# Patient Record
Sex: Male | Born: 1970 | Race: White | Hispanic: No | Marital: Married | Smoking: Former smoker
Health system: Southern US, Community
[De-identification: ages and names within clinical notes are randomized; demographics above are authoritative.]

## PROBLEM LIST (undated history)

## (undated) DIAGNOSIS — R222 Localized swelling, mass and lump, trunk: Secondary | ICD-10-CM

## (undated) DIAGNOSIS — K4090 Unilateral inguinal hernia, without obstruction or gangrene, not specified as recurrent: Secondary | ICD-10-CM

## (undated) HISTORY — PX: HERNIA REPAIR: SHX51

## (undated) HISTORY — PX: VASECTOMY: SHX75

## (undated) HISTORY — DX: Unilateral inguinal hernia, without obstruction or gangrene, not specified as recurrent: K40.90

## (undated) HISTORY — DX: Localized swelling, mass and lump, trunk: R22.2

---

## 1989-04-27 HISTORY — PX: THORACOTOMY: SUR1349

## 2017-10-13 ENCOUNTER — Ambulatory Visit (INDEPENDENT_AMBULATORY_CARE_PROVIDER_SITE_OTHER): Payer: BC Managed Care – PPO | Admitting: Osteopathic Medicine

## 2017-10-13 ENCOUNTER — Encounter (INDEPENDENT_AMBULATORY_CARE_PROVIDER_SITE_OTHER): Payer: Self-pay

## 2017-10-13 ENCOUNTER — Encounter: Payer: Self-pay | Admitting: Osteopathic Medicine

## 2017-10-13 VITALS — BP 121/83 | HR 59 | Temp 98.4°F | Ht 74.0 in | Wt 212.7 lb

## 2017-10-13 DIAGNOSIS — Z23 Encounter for immunization: Secondary | ICD-10-CM

## 2017-10-13 DIAGNOSIS — D229 Melanocytic nevi, unspecified: Secondary | ICD-10-CM

## 2017-10-13 DIAGNOSIS — H01149 Xeroderma of unspecified eye, unspecified eyelid: Secondary | ICD-10-CM

## 2017-10-13 DIAGNOSIS — F1010 Alcohol abuse, uncomplicated: Secondary | ICD-10-CM | POA: Diagnosis not present

## 2017-10-13 DIAGNOSIS — Z833 Family history of diabetes mellitus: Secondary | ICD-10-CM

## 2017-10-13 DIAGNOSIS — Z0000 Encounter for general adult medical examination without abnormal findings: Secondary | ICD-10-CM

## 2017-10-13 DIAGNOSIS — L821 Other seborrheic keratosis: Secondary | ICD-10-CM | POA: Diagnosis not present

## 2017-10-13 NOTE — Patient Instructions (Signed)
Mole appears benign but given that it's so dark, we would be smart to rule out melanoma. Please return to the office in the next month or so for removal.   Work on cutting back alcohol intake. Some people with alcohol use disorder are able to reduce their alcohol use to low-risk levels. Others need to completely quit drinking alcohol. When necessary, mental health professionals with specialized training in substance use treatment can help.If you drink, limit alcohol intake to no more than 1 drink a day for nonpregnant women and 2 drinks a day for men. One drink equals 12 oz of beer, 5 oz of wine, or 1 oz of hard liquor.  Will get blood work today for routine screening. Plan for colon cancer screening at 1. Recommend flu shot every fall (around Halloween).

## 2017-10-13 NOTE — Progress Notes (Signed)
HPI: Anthony Leach is a 47 y.o. male who  has no past medical history on file.  he presents to [facility] today, 10/13/17,  for chief complaint of: New to establish care   Pleasant new patient here to establish care. Works as a history Pharmacist, hospital, local high school. Is married, has two teen kids.   Hx rash/eczema - topical steroid to eyelids Desonide 0.05% 2-3 times per week. Skin tags there as well, seems to run in his family. No vision problems.   Reports occasional erectile issues. No depression or fatigue.   Concern for a mole on back, dark, no tsure how long it's been there, would like this looked at.   Former smoker. 1/4 ppd x2 years in the 190's. No illicit drug use. Reports alcohol overuse, 20 drinks per week, daily drinking. Few beers per day, he has been working on cutting back.   Hx vasectomy and thoracotomy to remove benign chest mass, diagnosis was uncertain but he had some followup Xrays for many years after and everything was normal. 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 Marland Kitchen   Has a living will.      Past medical, surgical, social and family history reviewed:  Patient Active Problem List   Diagnosis Date Noted  . Atypical mole 10/13/2017  . 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 Excessive drinking of alcohol 10/13/2017  . Family history of diabetes mellitus 10/13/2017    Past Surgical History:  Procedure Laterality Date  . THORACOTOMY  1991   Removal of benign mass in Chest area  . VASECTOMY      Social History   Tobacco Use  . Smoking status: Former Smoker    Types: Cigarettes    Last attempt to quit: 1990    Years since quitting: 29.4  . Smokeless tobacco: Never Used  Substance Use Topics  . Alcohol use: Yes    Comment: 20 DRINKS/WK    Family History  Problem Relation Age of Onset  . Diabetes Mother   . High blood  pressure Father      Current medication list and allergy/intolerance information reviewed:    Current Outpatient Medications  Medication Sig Dispense Refill  . desonide (DESOWEN) 0.05 % cream Apply topically 2 (two) times daily.     No current facility-administered medications for this visit.    No Known Allergies    Review of Systems:  Constitutional:  No  fever, no chills, No recent illness, No unintentional weight changes. No significant fatigue.   HEENT: No  headache, no vision change, no hearing change, No sore throat, No  sinus pressure  Cardiac: No  chest pain, No  pressure, No palpitations, No  Orthopnea  Respiratory:  No  shortness of breath. No  Cough  Gastrointestinal: No  abdominal pain, No  nausea, No  vomiting,  No  blood in stool, No  diarrhea, No  constipation   Musculoskeletal: No new myalgia/arthralgia  Skin: +Rash, +other wounds/concerning lesions  Genitourinary: No  incontinence, No  abnormal genital bleeding, No abnormal genital discharge  Endocrine: No cold intolerance,  No heat intolerance.    Neurologic: No  weakness, No  dizziness,   Psychiatric: No  concerns with depression, No  concerns with anxiety, No sleep problems, No mood problems  Exam:  BP 121/83   Pulse (!) 59   Temp 98.4 F (36.9 C) (Oral)   Ht  6\' 2"  (1.88 m)   Wt 212 lb 11.2 oz (96.5 kg)   BMI 27.31 kg/m   Constitutional: VS see above. General Appearance: alert, well-developed, well-nourished, NAD  Eyes: Normal lids and conjunctive, non-icteric sclera  Ears, Nose, Mouth, Throat: MMM, Normal external inspection ears/nares/mouth/lips/gums. TM normal bilaterally. Pharynx/tonsils no erythema, no exudate. Nasal mucosa normal.   Neck: No masses, trachea midline. No thyroid enlargement. No tenderness/mass appreciated. No lymphadenopathy  Respiratory: Normal respiratory effort. no wheeze, no rhonchi, no rales  Cardiovascular: S1/S2 normal, no murmur, no rub/gallop auscultated.  RRR. No lower extremity edema.   Gastrointestinal: Nontender, no masses. No hepatomegaly, no splenomegaly. No hernia appreciated. Bowel sounds normal. Rectal exam deferred.   Musculoskeletal: Gait normal. No clubbing/cyanosis of digits.   Neurological: Normal balance/coordination. No tremor. No cranial nerve deficit on limited exam. Motor and sensation intact and symmetric. Cerebellar reflexes intact.   Skin: warm, dry, intact. No rash/ulcer. No concerning nevi or subq nodules on limited exam.  Dry eyelids, no flaking skin. Small hyperpigmented mole on L back, see photo. Few benign seborrheic keratoses.    Psychiatric: Normal judgment/insight. Normal mood and affect. Oriented x3.          ASSESSMENT/PLAN:   Annual physical exam - Plan: CBC, COMPLETE METABOLIC PANEL WITH GFR, Lipid panel, Hemoglobin A1c  Xeroderma of eyelid, unspecified laterality - Plan: Ambulatory referral to Ophthalmology  Atypical mole - hyperpigmented but appears benign, will follow-up for biopsy removal   Seborrheic keratoses  Excessive drinking of alcohol  Family history of diabetes mellitus - Plan: Hemoglobin A1c  Need for Tdap vaccination - Plan: Tdap vaccine greater than or equal to 7yo IM    MALE PREVENTIVE CARE  updated 10/13/17  ANNUAL SCREENING/COUNSELING  Any changes to health in the past year? no  Diet/Exercise - HEALTHY HABITS DISCUSSED TO DECREASE CV RISK Social History   Tobacco Use  Smoking Status Former Smoker  . Types: Cigarettes  . Last attempt to quit: 1990  . Years since quitting: 29.4  Smokeless Tobacco Never Used   Social History   Substance and Sexual Activity  Alcohol Use Yes   Comment: 20 DRINKS/WK   Depression screen PHQ 2/9 10/13/2017  Decreased Interest 0  Down, Depressed, Hopeless 0  PHQ - 2 Score 0  Altered sleeping 0  Tired, decreased energy 0  Change in appetite 0  Feeling bad or failure about yourself  0  Trouble concentrating 0  Moving slowly or  fidgety/restless 0  Suicidal thoughts 0  PHQ-9 Score 0  Difficult doing work/chores Not difficult at all    Finger  Sexually active in the past year? - Yes with male.  STI testing needed/desired today? - no  Any concerns with testosterone/libido? - no  INFECTIOUS DISEASE SCREENING  HIV - does not need  GC/CT - does not need  HepC - does not need  TB - does not need  CANCER SCREENING  Lung - USPSTF: 55-80yo w/ 30 py hx unless quit w/in 48yr - does not need  Colon - does not need - no FH  Prostate - does not need - no FH  OTHER DISEASE SCREENING  Lipid - needs  DM2 - needs  AAA - 65-75yo ever smoked: does not need  Osteoporosis - men 48yo+ - does not need  ADULT VACCINATION  Influenza - annual vaccine recommended  Td - booster every 10 years   Zoster - Shingrix recommended 35+ years old  PCV13 - was not indicated  PPSV23 -  was not indicated Immunization History  Administered Date(s) Administered  . Tdap 10/13/2017    OTHER  Fall - exercise and Vit D age 64+ - does not need  Consider ASA - age 47-59 - does not need      Patient Instructions  Mole appears benign but given that it's so dark, we would be smart to rule out melanoma. Please return to the office in the next month or so for removal.   Work on cutting back alcohol intake. Some people with alcohol use disorder are able to reduce their alcohol use to low-risk levels. Others need to completely quit drinking alcohol. When necessary, mental health professionals with specialized training in substance use treatment can help.If you drink, limit alcohol intake to no more than 1 drink a day for nonpregnant women and 2 drinks a day for men. One drink equals 12 oz of beer, 5 oz of wine, or 1 oz of hard liquor.  Will get blood work today for routine screening. Plan for colon cancer screening at 66. Recommend flu shot every fall (around Halloween).      Visit summary with  medication list and pertinent instructions was printed for patient to review. All questions at time of visit were answered - patient instructed to contact office with any additional concerns. ER/RTC precautions were reviewed with the patient.   Follow-up plan: Return for biospy/removal abnormal mole .   Please note: voice recognition software was used to produce this document, and typos may escape review. Please contact Dr. Sheppard Coil for any needed clarifications.

## 2017-11-04 ENCOUNTER — Ambulatory Visit: Payer: BC Managed Care – PPO | Admitting: Osteopathic Medicine

## 2017-11-04 ENCOUNTER — Encounter: Payer: Self-pay | Admitting: Osteopathic Medicine

## 2017-11-04 VITALS — BP 113/77 | HR 88 | Temp 98.0°F | Ht 74.0 in | Wt 212.0 lb

## 2017-11-04 DIAGNOSIS — L819 Disorder of pigmentation, unspecified: Secondary | ICD-10-CM

## 2017-11-04 NOTE — Progress Notes (Addendum)
HPI: Anthony Leach is a 47 y.o. male who  has no past medical history on file.  he presents to [facility] today, 11/04/17,  for chief complaint of:  Skin procedure - mole removal/biopsy  Informed consent obtained verbally after discussion of risks/benefits:   Risks include but not limited to: incomplete excision, need for further procedures, bleeding, infection, pain, damage to surrounding tissues, anesthesia reaction  Benefits: removal of suspicious and bothersome lesion, definitive histological diagnosis.     ASSESSMENT/PLAN: The encounter diagnosis was Hyperpigmented skin lesion.   PRE-OP DIAGNOSIS: Abnormal Skin Lesion of left side of torso POST-OP DIAGNOSIS: Same  PROCEDURE: skin biopsy Performing Physician: Emeterio Reeve   PROCEDURE:  Excision biopsy    The area surrounding the skin lesion was prepared and draped in the usual sterile manner. The lesion was removed with a ellipse of skin in the usual manner by the biopsy method noted above with at least 1 mm margins. Hemostasis was assured. The patient tolerated the procedure well.   Closure:      Suture 4.0 prolene simple interrupted x4 w/ one horizontal mattress in the center (5 sutures totalL   Followup: The patient tolerated the procedure well without complications.  Standard post-procedure care is explained and return precautions are given.      Follow-up plan: Return in about 10 days (around 11/14/2017) for suture removal 10-14 days (11/15/17-11/18/17) w/ Evlyn Clines or whoever is free .     ############################################ ############################################ ############################################ ############################################    Outpatient Encounter Medications as of 11/04/2017  Medication Sig  . desonide (DESOWEN) 0.05 % cream Apply topically 2 (two) times daily.   No facility-administered encounter medications on file as of  11/04/2017.    No Known Allergies    Review of Systems:  Constitutional: No recent illness  Skin: No new Rash   Exam:  BP 113/77   Pulse 88   Temp 98 F (36.7 C) (Oral)   Ht 6\' 2"  (1.88 m)   Wt 212 lb (96.2 kg)   SpO2 98%   BMI 27.22 kg/m   Constitutional: VS see above. General Appearance: alert, well-developed, well-nourished, NAD  Respiratory: Normal respiratory effort.  Musculoskeletal: Gait normal.   Neurological: Normal balance/coordination. No tremor.  Skin: warm, dry, intact. Mole as pictured below. No significant asymmetry to hyperpigmented region, borders fairly clear, consistent color no blue/white, diameter 0.5 cm, pt reports no significant change in appearance for some time   Psychiatric: Normal judgment/insight. Normal mood and affect. Oriented x3.   Visit summary with medication list and pertinent instructions was printed for patient to review, advised to alert [place] if any changes needed. All questions at time of visit were answered - patient instructed to contact office with any additional concerns. ER/RTC precautions were reviewed with the patient and understanding verbalized.   Follow-up plan: Return in about 10 days (around 11/14/2017) for suture removal 10-14 days (11/15/17-11/18/17) w/ Evlyn Clines or whoever is free .    Please note: voice recognition software was used to produce this document, and typos may escape review. Please contact Dr. Sheppard Coil for any needed clarifications.

## 2017-11-04 NOTE — Patient Instructions (Signed)
Skin Biopsy, Care After Refer to this sheet in the next few weeks. These instructions provide you with information about caring for yourself after your procedure. Your health care provider may also give you more specific instructions. Your treatment has been planned according to current medical practices, but problems sometimes occur. Call your health care provider if you have any problems or questions after your procedure. What can I expect after the procedure? After the procedure, it is common to have:  Soreness.  Bruising.  Itching.  Follow these instructions at home:  Rest and then return to your normal activities as told by your health care provider.  Take over-the-counter and prescription medicines only as told by your health care provider.  Follow instructions from your health care provider about how to take care of your biopsy site.Make sure you: ? Wash your hands with soap and water before you change your bandage (dressing). If soap and water are not available, use hand sanitizer. ? Change your dressing as told by your health care provider. ? Leave stitches (sutures), skin glue, or adhesive strips in place. These skin closures may need to stay in place for 2 weeks or longer. If adhesive strip edges start to loosen and curl up, you may trim the loose edges. Do not remove adhesive strips completely unless your health care provider tells you to do that. If the biopsy area bleeds, apply gentle pressure for 10 minutes.  Check your biopsy site every day for signs of infection. Check for: ? More redness, swelling, or pain. ? More fluid or blood. ? Warmth. ? Pus or a bad smell.  Keep all follow-up visits as told by your health care provider. This is important. Contact a health care provider if:  You have more redness, swelling, or pain around your biopsy site.  You have more fluid or blood coming from your biopsy site.  Your biopsy site feels warm to the touch.  You have pus or  a bad smell coming from your biopsy site.  You have a fever. Get help right away if:  You have bleeding that does not stop with pressure or a dressing. This information is not intended to replace advice given to you by your health care provider. Make sure you discuss any questions you have with your health care provider. Document Released: 05/10/2015 Document Revised: 12/08/2015 Document Reviewed: 07/11/2014 Elsevier Interactive Patient Education  2018 Elsevier Inc.  

## 2017-11-15 ENCOUNTER — Ambulatory Visit (INDEPENDENT_AMBULATORY_CARE_PROVIDER_SITE_OTHER): Payer: BC Managed Care – PPO | Admitting: Physician Assistant

## 2017-11-15 ENCOUNTER — Encounter: Payer: Self-pay | Admitting: Physician Assistant

## 2017-11-15 VITALS — BP 118/80 | HR 55 | Wt 211.0 lb

## 2017-11-15 DIAGNOSIS — Z4802 Encounter for removal of sutures: Secondary | ICD-10-CM | POA: Diagnosis not present

## 2017-11-15 NOTE — Patient Instructions (Signed)
Suture Removal, Care After Refer to this sheet in the next few weeks. These instructions provide you with information on caring for yourself after your procedure. Your health care provider may also give you more specific instructions. Your treatment has been planned according to current medical practices, but problems sometimes occur. Call your health care provider if you have any problems or questions after your procedure. What can I expect after the procedure? After your stitches (sutures) are removed, it is typical to have the following:  Some discomfort and swelling in the wound area.  Slight redness in the area.  Follow these instructions at home:  If you have skin adhesive strips over the wound area, do not take the strips off. They will fall off on their own in a few days. If the strips remain in place after 14 days, you may remove them.  Change any bandages (dressings) at least once a day or as directed by your health care provider. If the bandage sticks, soak it off with warm, soapy water.  Apply cream or ointment only as directed by your health care provider. If using cream or ointment, wash the area with soap and water 2 times a day to remove all the cream or ointment. Rinse off the soap and pat the area dry with a clean towel.  Keep the wound area dry and clean. If the bandage becomes wet or dirty, or if it develops a bad smell, change it as soon as possible.  Continue to protect the wound from injury.  Use sunscreen when out in the sun. New scars become sunburned easily. Contact a health care provider if:  You have increasing redness, swelling, or pain in the wound.  You see pus coming from the wound.  You have a fever.  You notice a bad smell coming from the wound or dressing.  Your wound breaks open (edges not staying together). This information is not intended to replace advice given to you by your health care provider. Make sure you discuss any questions you have  with your health care provider. Document Released: 01/06/2001 Document Revised: 09/19/2015 Document Reviewed: 11/23/2012 Elsevier Interactive Patient Education  2017 Elsevier Inc.  

## 2017-11-15 NOTE — Progress Notes (Signed)
Subjective:    Anthony Leach is a 47 y.o. male who underwent excisional biopsy 10 days ago, which required closure with 5 sutures.  He denies pain, redness, or drainage from the wound.   The following portions of the patient's history were reviewed and updated as appropriate: allergies, current medications, past family history, past medical history, past social history, past surgical history and problem list.  Review of Systems Pertinent items are noted in HPI.    Objective:    BP 118/80   Pulse (!) 55   Wt 211 lb (95.7 kg)   BMI 27.09 kg/m  Injury exam:  A 1 cm incision noted on the right flank is healing well, without evidence of infection.    Assessment:    Laceration is healing well, without evidence of infection.    Plan:     1. 4 simple interrupted and 1 horizontal mattress sutures were removed. 2. Wound care discussed. 3. Follow up as needed.     Darlyne Russian PA-C

## 2018-01-25 LAB — COMPLETE METABOLIC PANEL WITH GFR
AG RATIO: 1.9 (calc) (ref 1.0–2.5)
ALBUMIN MSPROF: 4.5 g/dL (ref 3.6–5.1)
ALT: 24 U/L (ref 9–46)
AST: 19 U/L (ref 10–40)
Alkaline phosphatase (APISO): 55 U/L (ref 40–115)
BUN: 12 mg/dL (ref 7–25)
CALCIUM: 9.6 mg/dL (ref 8.6–10.3)
CO2: 27 mmol/L (ref 20–32)
Chloride: 101 mmol/L (ref 98–110)
Creat: 0.95 mg/dL (ref 0.60–1.35)
GFR, EST NON AFRICAN AMERICAN: 95 mL/min/{1.73_m2} (ref >=60)
GFR, Est African American: 110 mL/min/{1.73_m2} (ref >=60)
GLOBULIN: 2.4 g/dL (ref 1.9–3.7)
Glucose, Bld: 98 mg/dL (ref 65–139)
POTASSIUM: 4.1 mmol/L (ref 3.5–5.3)
SODIUM: 137 mmol/L (ref 135–146)
Total Bilirubin: 1 mg/dL (ref 0.2–1.2)
Total Protein: 6.9 g/dL (ref 6.1–8.1)

## 2018-01-25 LAB — CBC
HEMATOCRIT: 40.7 % (ref 38.5–50.0)
Hemoglobin: 14.1 g/dL (ref 13.2–17.1)
MCH: 31 pg (ref 27.0–33.0)
MCHC: 34.6 g/dL (ref 32.0–36.0)
MCV: 89.5 fL (ref 80.0–100.0)
MPV: 11 fL (ref 7.5–12.5)
Platelets: 204 10*3/uL (ref 140–400)
RBC: 4.55 10*6/uL (ref 4.20–5.80)
RDW: 12.1 % (ref 11.0–15.0)
WBC: 5.3 10*3/uL (ref 3.8–10.8)

## 2018-01-25 LAB — LIPID PANEL
Cholesterol: 219 mg/dL — ABNORMAL HIGH (ref <200)
HDL: 74 mg/dL (ref >40)
LDL Cholesterol (Calc): 127 mg/dL (calc) — ABNORMAL HIGH
NON-HDL CHOLESTEROL (CALC): 145 mg/dL — AB (ref <130)
Total CHOL/HDL Ratio: 3 (calc) (ref <5.0)
Triglycerides: 82 mg/dL (ref <150)

## 2018-01-25 LAB — HEMOGLOBIN A1C
EAG (MMOL/L): 5.7 (calc)
Hgb A1c MFr Bld: 5.2 % of total Hgb (ref <5.7)
Mean Plasma Glucose: 103 (calc)

## 2018-03-23 ENCOUNTER — Ambulatory Visit: Payer: BC Managed Care – PPO | Admitting: Sports Medicine

## 2018-04-04 ENCOUNTER — Encounter

## 2018-04-04 ENCOUNTER — Ambulatory Visit: Payer: BC Managed Care – PPO | Admitting: Sports Medicine

## 2018-04-04 ENCOUNTER — Encounter: Payer: Self-pay | Admitting: Sports Medicine

## 2018-04-04 ENCOUNTER — Ambulatory Visit: Payer: Self-pay

## 2018-04-04 VITALS — BP 124/82 | HR 79 | Ht 74.0 in | Wt 216.2 lb

## 2018-04-04 DIAGNOSIS — M25521 Pain in right elbow: Secondary | ICD-10-CM

## 2018-04-04 DIAGNOSIS — M7711 Lateral epicondylitis, right elbow: Secondary | ICD-10-CM | POA: Diagnosis not present

## 2018-04-04 MED ORDER — NITROGLYCERIN 0.2 MG/HR TD PT24: MEDICATED_PATCH | TRANSDERMAL | 1 refills | Status: DC

## 2018-04-04 NOTE — Procedures (Signed)
LIMITED MSK ULTRASOUND OF Right elbow Images were obtained and interpreted by myself, Teresa Coombs, DO  Images have been saved and stored to PACS system. Images obtained on: GE S7 Ultrasound machine  FINDINGS:   Common extensor tendons with marked hypoechoic change with in the midportion of the tendon at the lateral epicondyle.  Slight fragmentation of the superior aspect of the lateral epicondyle.  IMPRESSION:  1. Lateral epicondylosis

## 2018-04-04 NOTE — Progress Notes (Signed)
PROCEDURE NOTE: THERAPEUTIC EXERCISES (97110) 15 minutes spent for Therapeutic exercises as below and as referenced in the AVS.  This included exercises focusing on stretching, strengthening, with significant focus on eccentric aspects.   Proper technique shown and discussed handout in great detail with ATC.  All questions were discussed and answered.   Long term goals include an improvement in range of motion, strength, endurance as well as avoiding reinjury. Frequency of visits is one time as determined during today's  office visit. Frequency of exercises to be performed is as per handout.  EXERCISES REVIEWED: hammer exercises, and eccentric exercises

## 2018-04-04 NOTE — Progress Notes (Signed)
Anthony Leach, [facility] - 47 y.o. male MRN [id number]  Date of birth: [birth year]  Visit Date: 04/04/2018  PCP: Emeterio Reeve, DO   Referred by: Emeterio Reeve, DO   SUBJECTIVE:  Chief Complaint  Patient presents with  . New Patient (Initial Visit)    R arm pain   HPI: Patient with several months of worsening right pain that is progressively worsened the past 1 month.  He reports doing a moderate amount of manual labor in the yard this summer symptoms began time.  He has been using a tennis elbow strap occasionally outdoors with minimal improvement.  The pain is in the posterior arm and radiates into the posterior aspect of the forearm.  Activities that worsen this include picking up milk pronation supination and using a mouse.  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  Patient is using a counterforce strap with some mild improvement with this.  REVIEW OF SYSTEMS: Reports, Mild night time disturbances. Denies fevers, chills, or night sweats. Denies unexplained weight loss. Denies personal history of cancer. Denies changes in bowel or bladder habits. Denies recent unreported falls. Denies new or worsening dyspnea or wheezing. Denies headaches or dizziness.  Reports, Mild weakness in his right upper extremity otherwise no numbness, tingling or weakness  In the extremities.  Denies dizziness or presyncopal episodes Denies lower extremity edema   HISTORY:  Prior history reviewed and updated per electronic medical record.  Social History   Occupational History    Employer: [employer]  Tobacco Use  . Smoking status: Former Smoker    Types: Cigarettes    Last attempt to quit: 1990    Years since quitting: 29.9  . Smokeless tobacco: Never Used  Substance and Sexual Activity  . Alcohol use: Yes    Comment: 20 DRINKS/WK  . Drug use: Never  .  Sexual activity: Yes    Partners: Female    Birth control/protection: None   Social History   Social History Narrative  . Not on file    History reviewed. No pertinent past medical history.  Past Surgical History:  Procedure Laterality Date  . THORACOTOMY  1991   Removal of benign mass in Chest area  . VASECTOMY      family history includes Diabetes in his mother; High blood pressure in his father.  DATA OBTAINED & REVIEWED:   Recent Labs    10/13/17 1107  HGBA1C 5.2  CALCIUM 9.6  AST 19  ALT 24   No problems updated. No specialty comments available.  OBJECTIVE:  VS:  HT:6\' 2"  (188 cm)   WT:216 lb 3.2 oz (98.1 kg)  BMI:27.75    BP:124/82  HR:79bpm  TEMP: ( )  RESP:99 %   PHYSICAL EXAM: CONSTITUTIONAL: Well-developed, Well-nourished and In no acute distress PSYCHIATRIC : Alert & appropriately interactive. and Not depressed or anxious appearing. RESPIRATORY : No increased work of breathing and Trachea Midline EYES : Pupils are equal., EOM intact without nystagmus. and No scleral icterus.  VASCULAR EXAM : Warm and well perfused NEURO: unremarkable  MSK Exam:  Right elbow  Well aligned No significant deformity. No overlying skin changes TTP over: Posterior aspect of the forearm, extensor musculature and most focally over the lateral epicondyle.  No swelling No effusion Ligamentous stable No significant strength discrepancies however he does have pain with wrist extension.  Pain is  localized to the lateral epicondylitis.  Pain is also exacerbated with supination pronation activities.  No significant pain with third finger resistance.     ASSESSMENT  1. Right elbow pain   2. Lateral epicondylitis of right elbow     PLAN:  Pertinent additional documentation may be included in corresponding procedure notes, imaging studies, problem based documentation and patient instructions.  Procedures:  Discussed the foundation of treatment for this condition is  physical therapy and/or daily (5-6 days/week) therapeutic exercises, focusing on core strengthening, coordination, neuromuscular control/reeducation.  Therapeutic exercises prescribed per procedure note.  Medications:  Meds ordered this encounter  Medications  . nitroGLYCERIN (NITRODUR - DOSED IN MG/24 HR) 0.2 mg/hr patch    Sig: Place 1/4 to 1/2 of a patch over affected region. Remove and replace once daily.  Slightly alter skin placement daily    Dispense:  30 patch    Refill:  1    For musculoskeletal purposes.  Okay to cut patch.    Discussion/Instructions: No problem-specific Assessment & Plan notes found for this encounter.  Discussed red flag symptoms that warrant earlier emergent evaluation and patient voices understanding. Activity modifications and the importance of avoiding exacerbating activities (limiting pain to no more than a 4 / 10 during or following activity) recommended and discussed. Ultimately patient has tendinopathic changes on MSK ultrasound.  Nitroglycerin protocol recommended and discussed in detail.   If any lack of improvement: consider Biologic Therapy with PRP  Return in about 6 weeks (around 05/16/2018).          Gerda Diss, [facility] Physician

## 2018-04-04 NOTE — Patient Instructions (Signed)
Please perform the exercise program that we have prepared for you and gone over in detail on a daily basis.  In addition to the handout you were provided you can access your program through: [email or web address]   Your unique program code is:  [email or web address]    Nitroglycerin Protocol   Apply 1/4 nitroglycerin patch to affected area daily.  Change position of patch within the affected area every 24 hours.  You may experience a headache during the first 1-2 weeks of using the patch, these should subside.  If you experience headaches after beginning nitroglycerin patch treatment, you may take your preferred over the counter pain reliever.  Another side effect of the nitroglycerin patch is skin irritation or rash related to patch adhesive.  Please notify our office if you develop more severe headaches or rash, and stop the patch.  Tendon healing with nitroglycerin patch may require 12 to 24 weeks depending on the extent of injury.  Men should not use if taking Viagra, Cialis, or Levitra.   Do not use if you have migraines or rosacea.

## 2018-05-16 ENCOUNTER — Ambulatory Visit: Payer: BC Managed Care – PPO | Admitting: Sports Medicine

## 2018-05-16 ENCOUNTER — Encounter: Payer: Self-pay | Admitting: Sports Medicine

## 2018-05-16 VITALS — BP 140/84 | HR 66 | Ht 74.0 in | Wt 220.4 lb

## 2018-05-16 DIAGNOSIS — M7711 Lateral epicondylitis, right elbow: Secondary | ICD-10-CM

## 2018-05-16 DIAGNOSIS — M25521 Pain in right elbow: Secondary | ICD-10-CM | POA: Diagnosis not present

## 2018-05-16 NOTE — Progress Notes (Signed)
  Anthony Leach, [place] at [place] - 48 y.o. male MRN [id number]  Date of birth: [birth year]  Visit Date: May 16, 2018  PCP: Emeterio Reeve, DO   Referred by: Emeterio Reeve, DO  SUBJECTIVE:  Chief Complaint  Patient presents with  . Right Elbow - Follow-up    Sx are improving slightly .  Following Nitro Protocol, minimal HA initially, this has now resolved. Using Counterforce Strap prn. Provided with HEP - hammer exercises, and eccentric exercises. He has been doing these almost daily.      HPI: History reviewed as above.  He is doing quite well.  REVIEW OF SYSTEMS: Per HPI  HISTORY:  Prior history reviewed and updated per electronic medical record.  Social History   Occupational History    Employer: [employer]  Tobacco Use  . Smoking status: Former Smoker    Types: Cigarettes    Last attempt to quit: 1990    Years since quitting: 30.0  . Smokeless tobacco: Never Used  Substance and Sexual Activity  . Alcohol use: Yes    Comment: 20 DRINKS/WK  . Drug use: Never  . Sexual activity: Yes    Partners: Female    Birth control/protection: None   Social History   Social History Narrative  . Not on file    OBJECTIVE:  VS:  HT:6\' 2"  (188 cm)   WT:220 lb 6.4 oz (100 kg)  BMI:28.29    BP:140/84  HR:66bpm  TEMP: ( )  RESP:97 %   PHYSICAL EXAM: Adult male.  No acute distress.  Alert and appropriate. Right elbow has a small amount of pain over the lateral condyle focally but this is minimal.  Wrist extension strength is 5/5.  He has good elbow flexion and extension although some tightness with functional terminal range of motion.   ASSESSMENT  1. Right elbow pain   2. Lateral epicondylitis of right elbow      PROCEDURES:  None  PLAN:  Pertinent additional documentation may be included in corresponding procedure notes, imaging studies, problem based  documentation and patient instructions.  He is doing quite well.  We will plan to have him follow-up in 6 weeks and likely repeat ultrasound at that time.  He has made good progress and has only intermittent symptoms especially with overuse.  Titrate patch to half a patch in continue with gentle massage, home therapeutic exercises and icing plus counterforce brace as needed.  Activity modifications and the importance of avoiding exacerbating activities (limiting pain to no more than a 4 / 10 during or following activity) recommended and discussed. Discussed red flag symptoms that warrant earlier emergent evaluation and patient voices understanding.  At follow up will plan to consider repeat [facility] Ultrasound  Return in about 6 weeks (around 06/27/2018).          Gerda Diss, [facility] Physician

## 2018-06-29 ENCOUNTER — Encounter: Payer: Self-pay | Admitting: Sports Medicine

## 2018-06-29 ENCOUNTER — Ambulatory Visit: Payer: BC Managed Care – PPO | Admitting: Sports Medicine

## 2018-06-29 VITALS — BP 108/78 | HR 59 | Ht 74.0 in | Wt 217.0 lb

## 2018-06-29 DIAGNOSIS — M7711 Lateral epicondylitis, right elbow: Secondary | ICD-10-CM

## 2018-06-29 DIAGNOSIS — M25521 Pain in right elbow: Secondary | ICD-10-CM | POA: Diagnosis not present

## 2018-07-02 NOTE — Progress Notes (Signed)
Anthony Leach. Anthony Leach, [place] at [place] - 48 y.o. male MRN [id number]  Date of birth: 1970/12/01  Visit Date: 06/29/2018  PCP: Emeterio Reeve, DO   Referred by: Emeterio Reeve, DO  SUBJECTIVE:   Chief Complaint  Patient presents with  . Follow-up    R elbow.  Nitro.  Counterforce strap.  HEP for lat epicondylitis.    HPI: Patient reports 70 to 75% improvement of his right elbow pain.  Overall he is happy with his progress.  He is having less severe pain that is occurring less frequently and less intensely.  He is continue to use a counterforce strap and his home therapeutic exercises on a regular basis.  He has no significant radicular symptoms or radiating pain but does have some mild weakness with wrist extension.  REVIEW OF SYSTEMS: No significant nighttime awakenings due to this issue. Denies fevers, chills, recent weight gain or weight loss.  No night sweats.   HISTORY:  Prior history reviewed and updated per electronic medical record.  Patient Active Problem List   Diagnosis Date Noted  . Atypical mole 10/13/2017    hyperpigmented but appears benign, will follow-up for biopsy removal    . 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 Excessive drinking of alcohol 10/13/2017  . Family history of diabetes mellitus 10/13/2017   Social History   Occupational History    Employer: [employer]  Tobacco Use  . Smoking status: Former Smoker    Types: Cigarettes    Last attempt to quit: 1990    Years since quitting: 30.2  . Smokeless tobacco: Never Used  Substance and Sexual Activity  . Alcohol use: Yes    Comment: 20 DRINKS/WK  . Drug use: Never  . Sexual activity: Yes    Partners: Female    Birth control/protection: None   Social History   Social History Narrative  . Not on file    OBJECTIVE:  VS:  HT:6\' 2"  (188 cm)   WT:217 lb (98.4 kg)   BMI:27.85    BP:108/78  HR:(!) 59bpm  TEMP: ( )  RESP:97 %   PHYSICAL EXAM: Adult male. No acute distress.  Alert and appropriate. His right elbow is overall well aligned.  He does have some tension within the common extensor musculature.  Grip strength is intact.  He has pain that is over the lateral condyle that is mild.  He has more focal pain over the, and wrist extensor musculature.   ASSESSMENT:   1. Right elbow pain   2. Lateral epicondylitis of right elbow     PROCEDURES:  None  PLAN:  Pertinent additional documentation may be included in corresponding procedure notes, imaging studies, problem based documentation and patient instructions.  No problem-specific Assessment & Plan notes found for this encounter.   Overall he is doing quite well.  Seems to be continuing to improve and healing as expected.  Like for him to continue with the nitroglycerin patches for an additional 6 weeks and plan to discontinue them at the next follow-up visit.  If any persistent symptoms we will plan to repeat MSK ultrasound at follow-up but we did discuss trying to stretch his common extensor musculature on a more regular basis.  Continue previously prescribed home exercise program.   TENDINOPATHY - Discussed that the anticipated amount of time for healing is 12- 24 weeks for Tendinopathic changes.  Emphasized the importance  of improving blood flow as well as eccentric loading of the tendon.   Activity modifications and the importance of avoiding exacerbating activities (limiting pain to no more than a 4 / 10 during or following activity) recommended and discussed.  Discussed red flag symptoms that warrant earlier emergent evaluation and patient voices understanding.   No orders of the defined types were placed in this encounter.  Lab Orders  No laboratory test(s) ordered today   Imaging Orders  No imaging studies ordered today   Referral Orders  No referral(s) requested today     Return in about 6 weeks (around 08/10/2018) for repeat diagnostic ultrasound.          Gerda Diss, [facility] Physician

## 2018-08-10 ENCOUNTER — Ambulatory Visit: Payer: BC Managed Care – PPO | Admitting: Sports Medicine

## 2019-02-15 ENCOUNTER — Encounter: Payer: Self-pay | Admitting: Family Medicine

## 2019-02-15 ENCOUNTER — Other Ambulatory Visit: Payer: Self-pay

## 2019-02-15 ENCOUNTER — Ambulatory Visit (INDEPENDENT_AMBULATORY_CARE_PROVIDER_SITE_OTHER): Payer: BC Managed Care – PPO | Admitting: Family Medicine

## 2019-02-15 VITALS — BP 125/83 | HR 66 | Temp 98.1°F | Wt 212.0 lb

## 2019-02-15 DIAGNOSIS — K4000 Bilateral inguinal hernia, with obstruction, without gangrene, not specified as recurrent: Secondary | ICD-10-CM

## 2019-02-15 DIAGNOSIS — R222 Localized swelling, mass and lump, trunk: Secondary | ICD-10-CM

## 2019-02-15 DIAGNOSIS — K4090 Unilateral inguinal hernia, without obstruction or gangrene, not specified as recurrent: Secondary | ICD-10-CM | POA: Insufficient documentation

## 2019-02-15 DIAGNOSIS — Z0000 Encounter for general adult medical examination without abnormal findings: Secondary | ICD-10-CM | POA: Diagnosis not present

## 2019-02-15 DIAGNOSIS — Z6827 Body mass index (BMI) 27.0-27.9, adult: Secondary | ICD-10-CM

## 2019-02-15 DIAGNOSIS — N529 Male erectile dysfunction, unspecified: Secondary | ICD-10-CM

## 2019-02-15 DIAGNOSIS — R6882 Decreased libido: Secondary | ICD-10-CM | POA: Diagnosis not present

## 2019-02-15 MED ORDER — SILDENAFIL CITRATE 100 MG PO TABS: 50.0000 mg | ORAL_TABLET | Freq: Every day | ORAL | 11 refills | Status: DC | PRN

## 2019-02-15 NOTE — Patient Instructions (Signed)
Thank you for coming in today. You should hear soon about surgery referral.  Get fasting labs in the morning soon.  Lab opens at 8.  I will get results to you typically within a day of the labs.    Inguinal Hernia, Adult An inguinal hernia is when fat or your intestines push through a weak spot in a muscle where your leg meets your lower belly (groin). This causes a rounded lump (bulge). This kind of hernia could also be:  In your scrotum, if you are male.  In folds of skin around your vagina, if you are male. There are three types of inguinal hernias. These include:  Hernias that can be pushed back into the belly (are reducible). This type rarely causes pain.  Hernias that cannot be pushed back into the belly (are incarcerated).  Hernias that cannot be pushed back into the belly and lose their blood supply (are strangulated). This type needs emergency surgery. If you do not have symptoms, you may not need treatment. If you have symptoms or a large hernia, you may need surgery. Follow these instructions at home: Lifestyle  Do these things if told by your doctor so you do not have trouble pooping (constipation): ? Drink enough fluid to keep your pee (urine) pale yellow. ? Eat foods that have a lot of fiber. These include fresh fruits and vegetables, whole grains, and beans. ? Limit foods that are high in fat and processed sugars. These include foods that are fried or sweet. ? Take medicine for trouble pooping.  Avoid lifting heavy objects.  Avoid standing for long amounts of time.  Do not use any products that contain nicotine or tobacco. These include cigarettes and e-cigarettes. If you need help quitting, ask your doctor.  Stay at a healthy weight. General instructions  You may try to push your hernia in by very gently pressing on it when you are lying down. Do not try to force the bulge back in if it will not push in easily.  Watch your hernia for any changes in shape,  size, or color. Tell your doctor if you see any changes.  Take over-the-counter and prescription medicines only as told by your doctor.  Keep all follow-up visits as told by your doctor. This is important. Contact a doctor if:  You have a fever.  You have new symptoms.  Your symptoms get worse. Get help right away if:  The area where your leg meets your lower belly has: ? Pain that gets worse suddenly. ? A bulge that gets bigger suddenly, and it does not get smaller after that. ? A bulge that turns red or purple. ? A bulge that is painful when you touch it.  You are a man, and your scrotum: ? Suddenly feels painful. ? Suddenly changes in size.  You cannot push the hernia in by very gently pressing on it when you are lying down. Do not try to force the bulge back in if it will not push in easily.  You feel sick to your stomach (nauseous), and that feeling does not go away.  You throw up (vomit), and that keeps happening.  You have a fast heartbeat.  You cannot poop (have a bowel movement) or pass gas. These symptoms may be an emergency. Do not wait to see if the symptoms will go away. Get medical help right away. Call your local emergency services (911 in the [place] Summary  An inguinal hernia is when fat or your intestines  push through a weak spot in a muscle where your leg meets your lower belly (groin). This causes a rounded lump (bulge).  If you do not have symptoms, you may not need treatment. If you have symptoms or a large hernia, you may need surgery.  Avoid lifting heavy objects. Also avoid standing for long amounts of time.  Do not try to force the bulge back in if it will not push in easily. This information is not intended to replace advice given to you by your health care provider. Make sure you discuss any questions you have with your health care provider. Document Released: 05/14/2006 Document Revised: 05/15/2017 Document Reviewed: 01/13/2017 Elsevier  Patient Education  2020 [employer].

## 2019-02-15 NOTE — Progress Notes (Signed)
Anthony Leach is a 48 y.o. male who presents to [facility] today for well adult visit.   Marya Amsler is overall doing reasonably well.  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  He is trying to exercise and stay fit.  He notes he also has decreased his alcohol use quite a bit.  He notes however he has developed some bulging in his bilateral worse in the left groin.  This has been worsening over the last month or so.  He notes is not very painful but is somewhat bothersome.  He notes also around about the same time he has been having some low libido and some mild erectile dysfunction and is not sure if they are related.  Additionally about 30 years ago he had a benign chest mass removed.  He is not sure exactly what the diagnosis was but he knows that he has been recommended to have chest x-rays every year or 2 to follow this along.   Patient was seen last year for well adult visit.  He had a hyperpigmented skin lesion that was removed with skin biopsy found to be hyperpigmented seborrheic keratosis.  ROS as above:  Past Medical History:  Diagnosis Date  . Chest mass   . Inguinal hernia    Past Surgical History:  Procedure Laterality Date  . THORACOTOMY  1991   Removal of benign mass in Chest area  . VASECTOMY     Social History   Tobacco Use  . Smoking status: Former Smoker    Types: Cigarettes    Quit date: 1990    Years since quitting: 30.8  . Smokeless tobacco: Never Used  Substance Use Topics  . Alcohol use: Yes    Comment: 20 DRINKS/WK   family history includes Diabetes in his mother; High blood pressure in his father.  Medications: Current Outpatient Medications  Medication Sig Dispense Refill  . Multiple Vitamin (MULTIVITAMIN WITH MINERALS) TABS tablet Take 1 tablet by mouth daily.    . hydrocortisone 2.5 %  cream     . loratadine (CLARITIN) 10 MG tablet Take 10 mg by mouth daily.    . sildenafil (VIAGRA) 100 MG tablet Take 0.5-1 tablets (50-100 mg total) by mouth daily as needed for erectile dysfunction. 30 tablet 11   No current facility-administered medications for this visit.    No Known Allergies  Health Maintenance Health Maintenance  Topic Date Due  . HIV Screening  01/18/1985  . TETANUS/TDAP  10/14/2027  . INFLUENZA VACCINE  Completed     Exam:  BP 125/83   Pulse 66   Temp 98.1 F (36.7 C) (Oral)   Wt 212 lb (96.2 kg)   BMI 27.22 kg/m  Wt Readings from Last 5 Encounters:  02/15/19 212 lb (96.2 kg)  06/29/18 217 lb (98.4 kg)  05/16/18 220 lb 6.4 oz (100 kg)  04/04/18 216 lb 3.2 oz (98.1 kg)  11/15/17 211 lb (95.7 kg)      Gen: Well NAD HEENT: EOMI,  MMM Lungs: Normal work of breathing. CTABL Heart: RRR no MRG Abd: NABS, Soft. Nondistended, Nontender Exts: Brisk capillary refill, warm and well perfused.  Genitals: Bilateral inguinal swelling and bulging left worse than right. Palpable inguinal hernia at external inguinal ring left easily reducible.  Minimally present right.  Testicles descended bilaterally nontender no masses. Psych: Alert and oriented normal speech thought process and affect.  Depression screen [facility] 2/9 02/15/2019 10/13/2017  Decreased Interest 0 0  Down, Depressed, Hopeless 0 0  PHQ - 2 Score 0 0  Altered sleeping 1 0  Tired, decreased energy 0 0  Change in appetite 0 0  Feeling bad or failure about yourself  0 0  Trouble concentrating 0 0  Moving slowly or fidgety/restless 0 0  Suicidal thoughts 0 0  PHQ-9 Score 1 0  Difficult doing work/chores Not difficult at all Not difficult at all      Assessment and Plan: 48 y.o. male with  Well adult.  Doing reasonably well.  Discussed healthy lifestyle strategies..  Patient has evidence of bilateral left worse than right inguinal hernia.  Plan to refer to general surgery after discussion.   Expect likely will proceed with bilateral laparoscopic hernia repair.  Additionally having some low libido and erectile dysfunction.  Plan to treat with Viagra and check testosterone and PSA labs along with basic fasting labs as well routine physical.  Additionally will repeat chest x-ray to follow-up history of chest mass status post excision.  If all is well check back in 6 to 12 months with PCP.  PDMP not reviewed this encounter. Orders Placed This Encounter  Procedures  . DG Chest 2 View    Standing Status:   Future    Standing Expiration Date:   04/16/2020    Order Specific Question:   Reason for Exam (SYMPTOM  OR DIAGNOSIS REQUIRED)    Answer:   hsitory of chest mass removed 30 years ago. Routine montoring    Order Specific Question:   Preferred imaging location?    Answer:   [facility]    Order Specific Question:   Radiology Contrast Protocol - do NOT remove file path    Answer:   [email or web address]  . CBC  . COMPLETE METABOLIC PANEL WITH GFR  . Lipid Panel w/reflex Direct LDL  . Testosterone  . PSA  . Ambulatory referral to General Surgery    Referral Priority:   Routine    Referral Type:   Surgical    Referral Reason:   Specialty Services Required    Requested Specialty:   General Surgery    Number of Visits Requested:   1   Meds ordered this encounter  Medications  . sildenafil (VIAGRA) 100 MG tablet    Sig: Take 0.5-1 tablets (50-100 mg total) by mouth daily as needed for erectile dysfunction.    Dispense:  30 tablet    Refill:  11     Discussed warning signs or symptoms. Please see discharge instructions. Patient expresses understanding.

## 2019-02-16 ENCOUNTER — Ambulatory Visit (INDEPENDENT_AMBULATORY_CARE_PROVIDER_SITE_OTHER): Payer: BC Managed Care – PPO

## 2019-02-16 DIAGNOSIS — R222 Localized swelling, mass and lump, trunk: Secondary | ICD-10-CM

## 2019-02-17 LAB — COMPLETE METABOLIC PANEL WITH GFR
AG Ratio: 1.8 (calc) (ref 1.0–2.5)
ALT: 23 U/L (ref 9–46)
AST: 18 U/L (ref 10–40)
Albumin: 4.2 g/dL (ref 3.6–5.1)
Alkaline phosphatase (APISO): 52 U/L (ref 36–130)
BUN: 20 mg/dL (ref 7–25)
CO2: 26 mmol/L (ref 20–32)
Calcium: 9 mg/dL (ref 8.6–10.3)
Chloride: 106 mmol/L (ref 98–110)
Creat: 0.97 mg/dL (ref 0.60–1.35)
GFR, Est African American: 106 mL/min/{1.73_m2} (ref >=60)
GFR, Est Non African American: 91 mL/min/{1.73_m2} (ref >=60)
Globulin: 2.4 g/dL (calc) (ref 1.9–3.7)
Glucose, Bld: 104 mg/dL — ABNORMAL HIGH (ref 65–99)
Potassium: 4.4 mmol/L (ref 3.5–5.3)
Sodium: 140 mmol/L (ref 135–146)
Total Bilirubin: 0.7 mg/dL (ref 0.2–1.2)
Total Protein: 6.6 g/dL (ref 6.1–8.1)

## 2019-02-17 LAB — CBC
HCT: 40.9 % (ref 38.5–50.0)
Hemoglobin: 13.8 g/dL (ref 13.2–17.1)
MCH: 30.8 pg (ref 27.0–33.0)
MCHC: 33.7 g/dL (ref 32.0–36.0)
MCV: 91.3 fL (ref 80.0–100.0)
MPV: 11.1 fL (ref 7.5–12.5)
Platelets: 225 10*3/uL (ref 140–400)
RBC: 4.48 10*6/uL (ref 4.20–5.80)
RDW: 12.1 % (ref 11.0–15.0)
WBC: 5.3 10*3/uL (ref 3.8–10.8)

## 2019-02-17 LAB — LIPID PANEL W/REFLEX DIRECT LDL
Cholesterol: 201 mg/dL — ABNORMAL HIGH (ref <200)
HDL: 63 mg/dL (ref >=40)
LDL Cholesterol (Calc): 119 mg/dL (calc) — ABNORMAL HIGH
Non-HDL Cholesterol (Calc): 138 mg/dL (calc) — ABNORMAL HIGH (ref <130)
Total CHOL/HDL Ratio: 3.2 (calc) (ref <5.0)
Triglycerides: 86 mg/dL (ref <150)

## 2019-02-17 LAB — TESTOSTERONE: Testosterone: 442 ng/dL (ref 250–827)

## 2019-02-17 LAB — PSA: PSA: 0.8 ng/mL (ref <=4.0)

## 2019-03-06 ENCOUNTER — Other Ambulatory Visit: Payer: Self-pay | Admitting: Surgery

## 2020-01-21 ENCOUNTER — Emergency Department
Admission: EM | Admit: 2020-01-21 | Discharge: 2020-01-21 | Disposition: A | Discharge status: Home / Self Care | Payer: BC Managed Care – PPO | Source: Home / Self Care | Attending: Family Medicine | Admitting: Family Medicine

## 2020-01-21 ENCOUNTER — Other Ambulatory Visit: Payer: Self-pay

## 2020-01-21 ENCOUNTER — Emergency Department (INDEPENDENT_AMBULATORY_CARE_PROVIDER_SITE_OTHER): Payer: BC Managed Care – PPO

## 2020-01-21 DIAGNOSIS — M5416 Radiculopathy, lumbar region: Secondary | ICD-10-CM | POA: Diagnosis not present

## 2020-01-21 DIAGNOSIS — M545 Low back pain: Secondary | ICD-10-CM | POA: Diagnosis not present

## 2020-01-21 MED ORDER — PREDNISONE 20 MG PO TABS: ORAL_TABLET | ORAL | 0 refills | Status: DC

## 2020-01-21 MED ORDER — KETOROLAC TROMETHAMINE 60 MG/2ML IM SOLN
60.0000 mg | Freq: Once | INTRAMUSCULAR | Status: AC
  Administered 2020-01-21: 60 mg via INTRAMUSCULAR

## 2020-01-21 MED ORDER — CYCLOBENZAPRINE HCL 10 MG PO TABS: 10.0000 mg | ORAL_TABLET | Freq: Three times a day (TID) | ORAL | 0 refills | Status: DC | PRN

## 2020-01-21 NOTE — ED Provider Notes (Signed)
[facility]    CSN: [id number] Arrival date & time: 01/21/20  1032      History   Chief Complaint No chief complaint on file.   HPI Anthony Leach is a 49 y.o. male.   Patient states that he "tweaked" his back while cleaning gutters one week ago, but did not have significant pain afterwards.  Yesterday during minor activity he had sudden "searing" pain in his left lower back and hip.  He denies bowel or bladder dysfunction, and no saddle numbness.   Ibuprofen has not been helpful, and oxycodone is somewhat helpful.  The history is provided by the patient.  Back Pain Location:  Lumbar spine Quality:  Stabbing Radiates to: left hip. Pain severity:  Moderate Pain is:  Same all the time Onset quality:  Sudden Duration:  1 day Timing:  Constant Progression:  Unchanged Chronicity:  New Context: twisting   Context: not lifting heavy objects and not recent injury   Relieved by:  Narcotics Worsened by:  Movement Ineffective treatments:  NSAIDs Associated symptoms: no abdominal pain, no abdominal swelling, no bladder incontinence, no bowel incontinence, no dysuria, no fever, no leg pain, no numbness, no paresthesias, no pelvic pain, no perianal numbness, no tingling, no weakness and no weight loss     Past Medical History:  Diagnosis Date  . Chest mass   . Inguinal hernia     Patient Active Problem List   Diagnosis Date Noted  . Inguinal hernia 02/15/2019  . Low libido 02/15/2019  . ED (erectile dysfunction) 02/15/2019  . Seborrheic keratosis 10/13/2017  . 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 Excessive drinking of alcohol 10/13/2017  . Family history of diabetes mellitus 10/13/2017    Past Surgical History:  Procedure Laterality Date  . HERNIA REPAIR    . THORACOTOMY  1991   Removal of benign mass in Chest area  . VASECTOMY         Home Medications    Prior to Admission medications   Medication Sig Start Date End Date Taking? Authorizing Provider    hydrocortisone 2.5 % cream  10/22/17  Yes [provider]  IBUPROFEN PO Take by mouth.   Yes [provider]  loratadine (CLARITIN) 10 MG tablet Take 10 mg by mouth daily.   Yes [provider]  Multiple Vitamin (MULTIVITAMIN WITH MINERALS) TABS tablet Take 1 tablet by mouth daily.   Yes [provider]  oxyCODONE-Acetaminophen 10-300 MG/5ML SOLN Take by mouth.   Yes [provider]  sildenafil (VIAGRA) 100 MG tablet Take 0.5-1 tablets (50-100 mg total) by mouth daily as needed for erectile dysfunction. 02/15/19  Yes Gregor Hams, MD  cyclobenzaprine (FLEXERIL) 10 MG tablet Take 1 tablet (10 mg total) by mouth 3 (three) times daily as needed for muscle spasms. 01/21/20   Kandra Nicolas, MD  predniSONE (DELTASONE) 20 MG tablet Take one tab by mouth twice daily for 5 days, then one daily. Take with food. 01/21/20   Kandra Nicolas, MD    Family History Family History  Problem Relation Age of Onset  . Diabetes Mother   . High blood pressure Father     Social History Social History   Tobacco Use  . Smoking status: Former Smoker    Types: Cigarettes    Quit date: 1990    Years since quitting: 31.7  . Smokeless tobacco: Never Used  Vaping Use  . Vaping Use: Never used  Substance Use Topics  . Alcohol  use: Yes    Comment: 20 DRINKS/WK  . Drug use: Never     Allergies   Patient has no known allergies.   Review of Systems Review of Systems  Constitutional: Positive for activity change. Negative for fever and weight loss.  Gastrointestinal: Negative for abdominal pain and bowel incontinence.  Genitourinary: Negative for bladder incontinence, dysuria and pelvic pain.  Musculoskeletal: Positive for back pain.  Skin: Negative for rash.  Neurological: Negative for tingling, weakness, numbness and paresthesias.  All other systems reviewed and are negative.    Physical Exam Triage Vital Signs ED Triage Vitals  Enc Vitals Group      BP 01/21/20 1230 117/81     Pulse Rate 01/21/20 1230 74     Resp --      Temp 01/21/20 1230 98 F (36.7 C)     Temp Source 01/21/20 1230 Oral     SpO2 01/21/20 1230 98 %     Weight 01/21/20 1228 210 lb (95.3 kg)     Height 01/21/20 1228 6\' 2"  (1.88 m)     Head Circumference --      Peak Flow --      Pain Score 01/21/20 1228 10     Pain Loc --      Pain Edu? --      Excl. in [place]? --    No data found.  Updated Vital Signs BP 117/81 (BP Location: Right Arm)   Pulse 74   Temp 98 F (36.7 C) (Oral)   Ht 6\' 2"  (1.88 m)   Wt 95.3 kg   SpO2 98%   BMI 26.96 kg/m   Visual Acuity Right Eye Distance:   Left Eye Distance:   Bilateral Distance:    Right Eye Near:   Left Eye Near:    Bilateral Near:     Physical Exam Vitals and nursing note reviewed.  Constitutional:      General: He is not in acute distress. HENT:     Head: Normocephalic.     Mouth/Throat:     Pharynx: Oropharynx is clear.  Eyes:     Conjunctiva/sclera: Conjunctivae normal.     Pupils: Pupils are equal, round, and reactive to light.  Cardiovascular:     Rate and Rhythm: Normal rate and regular rhythm.     Heart sounds: Normal heart sounds.  Pulmonary:     Breath sounds: Normal breath sounds.  Abdominal:     Palpations: Abdomen is soft.     Tenderness: There is no abdominal tenderness.  Musculoskeletal:     Cervical back: Normal range of motion.       Back:     Right lower leg: No edema.     Left lower leg: No edema.     Comments: Back:  Can heel/toe walk and squat without difficulty.  Decreased forward flexion.  Tenderness in the midline and left paraspinous muscles from L3 to Sacral area.  Straight leg raising test is negative.  Sitting knee extension test is negative.  Strength and sensation in the lower extremities is normal.  Patellar and achilles reflexes are normal   Skin:    General: Skin is warm and dry.     Findings: No rash.  Neurological:     Mental Status: He is alert and oriented to  person, place, and time.      UC Treatments / Results  Labs (all labs ordered are listed, but only abnormal results are displayed) Labs Reviewed - No data to  display  EKG   Radiology DG Lumbar Spine Complete  Result Date: 01/21/2020 CLINICAL DATA:  Patient with low back pain for 1 week. EXAM: LUMBAR SPINE - COMPLETE 4+ VIEW COMPARISON:  None. FINDINGS: Normal anatomic alignment. No evidence or acute fracture or dislocation. Mild lower lumbar spine degenerative disc and facet disease. SI joints unremarkable. IMPRESSION: Mild lower lumbar spine degenerative changes.  No acute process. Electronically Signed   By: Lovey Newcomer M.D.   On: 01/21/2020 13:54    Procedures Procedures (including critical care time)  Medications Ordered in UC Medications - No data to display  Initial Impression / Assessment and Plan / UC Course  I have reviewed the triage vital signs and the nursing notes.  Pertinent labs & imaging results that were available during my care of the patient were reviewed by me and considered in my medical decision making (see chart for details).    Begin prednisone burst/taper.  Rx for Flexeril. Given sprain treatment instructions with range of motion and stretching exercises.  Followup with Dr. Aundria Mems ([facility]) if not improving about two weeks.  Final Clinical Impressions(s) / UC Diagnoses   Final diagnoses:  Lumbar back pain with radiculopathy affecting left lower extremity     Discharge Instructions     Apply ice pack for 20 to 30 minutes, 3 to 4 times daily  Continue until pain and swelling decrease.   May take Tylenol daytime as needed for pain. When pain has improved significantly, may begin range of motion and stretching exercises.    ED Prescriptions    Medication Sig Dispense Auth. Provider   predniSONE (DELTASONE) 20 MG tablet Take one tab by mouth twice daily for 5 days, then one daily. Take with food. 15 tablet Kandra Nicolas, MD   cyclobenzaprine (FLEXERIL) 10 MG tablet Take 1 tablet (10 mg total) by mouth 3 (three) times daily as needed for muscle spasms. 15 tablet Kandra Nicolas, MD        Kandra Nicolas, MD 01/24/20 [phone]

## 2020-01-21 NOTE — ED Triage Notes (Signed)
Pt states that he has some lower back and hip pain. x1 week . Pt states that the pain is worse today. Pt states that he is vaccinated. Pt states that he hasn't injured his back.

## 2020-01-21 NOTE — Discharge Instructions (Addendum)
Apply ice pack for 20 to 30 minutes, 3 to 4 times daily  Continue until pain and swelling decrease.   May take Tylenol daytime as needed for pain. When pain has improved significantly, may begin range of motion and stretching exercises.

## 2020-02-19 ENCOUNTER — Other Ambulatory Visit: Payer: Self-pay | Admitting: Family Medicine

## 2020-02-26 NOTE — Telephone Encounter (Signed)
Pt no longer under Dr. Clovis Riley care. Refill denied.

## 2020-03-04 ENCOUNTER — Telehealth: Payer: Self-pay

## 2020-03-04 NOTE — Telephone Encounter (Signed)
Lorenza's wife called and states Armanii would like to switch his care to a male provider. He would like to switch to Dr Zigmund Daniel.

## 2020-03-04 NOTE — Telephone Encounter (Signed)
I'm ok with this if Ok with Dr. Sheppard Coil

## 2020-03-04 NOTE — Telephone Encounter (Signed)
Patient's wife advised. She will have him call to schedule.

## 2020-03-04 NOTE — Telephone Encounter (Signed)
Fine w/ me too  

## 2020-03-19 ENCOUNTER — Encounter: Payer: Self-pay | Admitting: Family Medicine

## 2020-03-19 ENCOUNTER — Ambulatory Visit (INDEPENDENT_AMBULATORY_CARE_PROVIDER_SITE_OTHER): Payer: BC Managed Care – PPO | Admitting: Family Medicine

## 2020-03-19 VITALS — BP 140/86 | HR 66 | Temp 98.1°F | Wt 216.2 lb

## 2020-03-19 DIAGNOSIS — Z125 Encounter for screening for malignant neoplasm of prostate: Secondary | ICD-10-CM

## 2020-03-19 DIAGNOSIS — Z13220 Encounter for screening for lipoid disorders: Secondary | ICD-10-CM | POA: Diagnosis not present

## 2020-03-19 DIAGNOSIS — Z0000 Encounter for general adult medical examination without abnormal findings: Secondary | ICD-10-CM

## 2020-03-19 DIAGNOSIS — N529 Male erectile dysfunction, unspecified: Secondary | ICD-10-CM

## 2020-03-19 DIAGNOSIS — M5442 Lumbago with sciatica, left side: Secondary | ICD-10-CM | POA: Diagnosis not present

## 2020-03-19 DIAGNOSIS — M5430 Sciatica, unspecified side: Secondary | ICD-10-CM | POA: Insufficient documentation

## 2020-03-19 LAB — COMPLETE METABOLIC PANEL WITH GFR
AG Ratio: 1.7 (calc) (ref 1.0–2.5)
ALT: 29 U/L (ref 9–46)
AST: 22 U/L (ref 10–35)
Albumin: 4.5 g/dL (ref 3.6–5.1)
Alkaline phosphatase (APISO): 62 U/L (ref 35–144)
BUN: 13 mg/dL (ref 7–25)
CO2: 30 mmol/L (ref 20–32)
Calcium: 9.8 mg/dL (ref 8.6–10.3)
Chloride: 101 mmol/L (ref 98–110)
Creat: 0.96 mg/dL (ref 0.70–1.33)
GFR, Est African American: 106 mL/min/{1.73_m2} (ref >=60)
GFR, Est Non African American: 92 mL/min/{1.73_m2} (ref >=60)
Globulin: 2.6 g/dL (calc) (ref 1.9–3.7)
Glucose, Bld: 98 mg/dL (ref 65–99)
Potassium: 4.7 mmol/L (ref 3.5–5.3)
Sodium: 139 mmol/L (ref 135–146)
Total Bilirubin: 0.5 mg/dL (ref 0.2–1.2)
Total Protein: 7.1 g/dL (ref 6.1–8.1)

## 2020-03-19 LAB — CBC
HCT: 41.5 % (ref 38.5–50.0)
Hemoglobin: 14.2 g/dL (ref 13.2–17.1)
MCH: 31.3 pg (ref 27.0–33.0)
MCHC: 34.2 g/dL (ref 32.0–36.0)
MCV: 91.6 fL (ref 80.0–100.0)
MPV: 11.1 fL (ref 7.5–12.5)
Platelets: 248 10*3/uL (ref 140–400)
RBC: 4.53 10*6/uL (ref 4.20–5.80)
RDW: 12.4 % (ref 11.0–15.0)
WBC: 4.8 10*3/uL (ref 3.8–10.8)

## 2020-03-19 LAB — PSA: PSA: 0.84 ng/mL (ref <=4.0)

## 2020-03-19 LAB — LIPID PANEL
Cholesterol: 210 mg/dL — ABNORMAL HIGH (ref <200)
HDL: 75 mg/dL (ref >=40)
LDL Cholesterol (Calc): 113 mg/dL (calc) — ABNORMAL HIGH
Non-HDL Cholesterol (Calc): 135 mg/dL (calc) — ABNORMAL HIGH (ref <130)
Total CHOL/HDL Ratio: 2.8 (calc) (ref <5.0)
Triglycerides: 113 mg/dL (ref <150)

## 2020-03-19 MED ORDER — TADALAFIL 20 MG PO TABS: 10.0000 mg | ORAL_TABLET | ORAL | 6 refills | Status: DC | PRN

## 2020-03-19 NOTE — Progress Notes (Signed)
Anthony Leach - 49 y.o. male MRN [id number]  Date of birth: 04/09/1971  Subjective Chief Complaint  Patient presents with  . Establish Care    HPI Anthony Leach is a 49 y.o. male here today for annual exam.  He has been in fairly good health.  He has been using sildenafil as needed which works well but would like to change to tadalafil for improved spontaneity.    He had some issues with L sided sciatica recently.  Sciatica improved but still having low back pain.  He has been doing some home exercises.    He is a non-smoker.  He has about 20 drinks containing EtOH each week.  He is aware this is a little too much and plans to work on cutting back.    He runs for exercise and feels like his diet is pretty good.   He would like to have colonoscopy but would prefer to defer until next summer when he is out of school.    He had COVID vaccine and booster.  He also had flu vaccine already.    Review of Systems  Constitutional: Negative for chills, fever, malaise/fatigue and weight loss.  HENT: Negative for congestion, ear pain and sore throat.   Eyes: Negative for blurred vision, double vision and pain.  Respiratory: Negative for cough and shortness of breath.   Cardiovascular: Negative for chest pain and palpitations.  Gastrointestinal: Negative for abdominal pain, blood in stool, constipation, heartburn and nausea.  Genitourinary: Negative for dysuria and urgency.  Musculoskeletal: Negative for joint pain and myalgias.  Neurological: Negative for dizziness and headaches.  Endo/Heme/Allergies: Does not bruise/bleed easily.  Psychiatric/Behavioral: Negative for depression. The patient is not nervous/anxious and does not have insomnia.     No Known Allergies  Past Medical History:  Diagnosis Date  . Ganglioneuroma   . Inguinal hernia     Past Surgical History:  Procedure Laterality Date  . HERNIA REPAIR    . THORACOTOMY  1991   Removal of benign mass in Chest area  .  VASECTOMY      Social History   Socioeconomic History  . Marital status: Married    Spouse name: Not on file  . Number of children: Not on file  . Years of education: Not on file  . Highest education level: Not on file  Occupational History    Employer: [employer]  Tobacco Use  . Smoking status: Former Smoker    Types: Cigarettes    Quit date: 1990    Years since quitting: 31.9  . Smokeless tobacco: Never Used  Vaping Use  . Vaping Use: Never used  Substance and Sexual Activity  . Alcohol use: Yes    Comment: 20 DRINKS/WK  . Drug use: Never  . Sexual activity: Yes    Partners: Female    Birth control/protection: None  Other Topics Concern  . Not on file  Social History Narrative  . Not on file   Social Determinants of Health   Financial Resource Strain:   . Difficulty of Paying Living Expenses: Not on file  Food Insecurity:   . Worried About Charity fundraiser in the Last Year: Not on file  . Ran Out of Food in the Last Year: Not on file  Transportation Needs:   . Lack of Transportation (Medical): Not on file  . Lack of Transportation (Non-Medical): Not on file  Physical Activity:   . Days of Exercise per Week: Not on  file  . Minutes of Exercise per Session: Not on file  Stress:   . Feeling of Stress : Not on file  Social Connections:   . Frequency of Communication with Friends and Family: Not on file  . Frequency of Social Gatherings with Friends and Family: Not on file  . Attends Religious Services: Not on file  . Active Member of Clubs or Organizations: Not on file  . Attends Archivist Meetings: Not on file  . Marital Status: Not on file    Family History  Problem Relation Age of Onset  . Diabetes Mother   . High blood pressure Father     Health Maintenance  Topic Date Due  . Hepatitis C Screening  Never done  . HIV Screening  Never done  . COLONOSCOPY  Never done  . TETANUS/TDAP  10/14/2027  . INFLUENZA VACCINE   Completed  . COVID-19 Vaccine  Completed     ----------------------------------------------------------------------------------------------------------------------------------------------------------------------------------------------------------------- Physical Exam BP 140/86 (BP Location: Left Arm, Patient Position: Sitting, Cuff Size: Normal)   Pulse 66   Temp 98.1 F (36.7 C)   Wt 216 lb 3.2 oz (98.1 kg)   SpO2 100%   BMI 27.76 kg/m   Physical Exam Constitutional:      General: He is not in acute distress. HENT:     Head: Normocephalic and atraumatic.     Right Ear: Tympanic membrane and external ear normal.     Left Ear: Tympanic membrane and external ear normal.     Mouth/Throat:     Mouth: Mucous membranes are moist.  Eyes:     General: No scleral icterus. Neck:     Thyroid: No thyromegaly.  Cardiovascular:     Rate and Rhythm: Normal rate and regular rhythm.     Heart sounds: Normal heart sounds.  Pulmonary:     Effort: Pulmonary effort is normal.     Breath sounds: Normal breath sounds.  Abdominal:     General: Bowel sounds are normal. There is no distension.     Palpations: Abdomen is soft.     Tenderness: There is no abdominal tenderness. There is no guarding.  Musculoskeletal:     Cervical back: Normal range of motion.  Lymphadenopathy:     Cervical: No cervical adenopathy.  Skin:    General: Skin is warm and dry.     Findings: No rash.  Neurological:     Mental Status: He is alert and oriented to person, place, and time.     Cranial Nerves: No cranial nerve deficit.     Motor: No abnormal muscle tone.  Psychiatric:        Mood and Affect: Mood normal.        Behavior: Behavior normal.     ------------------------------------------------------------------------------------------------------------------------------------------------------------------------------------------------------------------- Assessment and Plan  Well adult exam Well  adult Orders Placed This Encounter  Procedures  . COMPLETE METABOLIC PANEL WITH GFR  . CBC  . Lipid Profile  . PSA  . Ambulatory referral to Physical Therapy    Referral Priority:   Routine    Referral Type:   Physical Medicine    Referral Reason:   Specialty Services Required    Requested Specialty:   Physical Therapy    Number of Visits Requested:   1  Screening: PSA, Lipid panel.  Defers colonoscopy until October.   Immunizations: UTD Anticipatory guidance/Risk factor reduction:  Recommendations per AVS.    ED (erectile dysfunction) Change sildenafil to tadalafil.  Side effects reviewed.   Acute  left-sided low back pain with left-sided sciatica Referral made to physical therapy for continued low back pain.  If not improving with this will proceed with imaging.     Meds ordered this encounter  Medications  . tadalafil (CIALIS) 20 MG tablet    Sig: Take 0.5-1 tablets (10-20 mg total) by mouth every other day as needed for erectile dysfunction.    Dispense:  15 tablet    Refill:  6    No follow-ups on file.    This visit occurred during the SARS-CoV-2 public health emergency.  Safety protocols were in place, including screening questions prior to the visit, additional usage of staff PPE, and extensive cleaning of exam room while observing appropriate contact time as indicated for disinfecting solutions.

## 2020-03-19 NOTE — Assessment & Plan Note (Signed)
Change sildenafil to tadalafil.  Side effects reviewed.

## 2020-03-19 NOTE — Patient Instructions (Signed)
Let me know when you're schedule allows to get you set up for colonoscopy.    Preventive Care 38-50 Years Old, Male Preventive care refers to lifestyle choices and visits with your health care provider that can promote health and wellness. This includes:  A yearly physical exam. This is also called an annual well check.  Regular dental and eye exams.  Immunizations.  Screening for certain conditions.  Healthy lifestyle choices, such as eating a healthy diet, getting regular exercise, not using drugs or products that contain nicotine and tobacco, and limiting alcohol use. What can I expect for my preventive care visit? Physical exam Your health care provider will check:  Height and weight. These may be used to calculate body mass index (BMI), which is a measurement that tells if you are at a healthy weight.  Heart rate and blood pressure.  Your skin for abnormal spots. Counseling Your health care provider may ask you questions about:  Alcohol, tobacco, and drug use.  Emotional well-being.  Home and relationship well-being.  Sexual activity.  Eating habits.  Work and work Statistician. What immunizations do I need?  Influenza (flu) vaccine  This is recommended every year. Tetanus, diphtheria, and pertussis (Tdap) vaccine  You may need a Td booster every 10 years. Varicella (chickenpox) vaccine  You may need this vaccine if you have not already been vaccinated. Zoster (shingles) vaccine  You may need this after age 72. Measles, mumps, and rubella (MMR) vaccine  You may need at least one dose of MMR if you were born in 1957 or later. You may also need a second dose. Pneumococcal conjugate (PCV13) vaccine  You may need this if you have certain conditions and were not previously vaccinated. Pneumococcal polysaccharide (PPSV23) vaccine  You may need one or two doses if you smoke cigarettes or if you have certain conditions. Meningococcal conjugate (MenACWY)  vaccine  You may need this if you have certain conditions. Hepatitis A vaccine  You may need this if you have certain conditions or if you travel or work in places where you may be exposed to hepatitis A. Hepatitis B vaccine  You may need this if you have certain conditions or if you travel or work in places where you may be exposed to hepatitis B. Haemophilus influenzae type b (Hib) vaccine  You may need this if you have certain risk factors. Human papillomavirus (HPV) vaccine  If recommended by your health care provider, you may need three doses over 6 months. You may receive vaccines as individual doses or as more than one vaccine together in one shot (combination vaccines). Talk with your health care provider about the risks and benefits of combination vaccines. What tests do I need? Blood tests  Lipid and cholesterol levels. These may be checked every 5 years, or more frequently if you are over 50 years old.  Hepatitis C test.  Hepatitis B test. Screening  Lung cancer screening. You may have this screening every year starting at age 25 if you have a 30-pack-year history of smoking and currently smoke or have quit within the past 15 years.  Prostate cancer screening. Recommendations will vary depending on your family history and other risks.  Colorectal cancer screening. All adults should have this screening starting at age 94 and continuing until age 68. Your health care provider may recommend screening at age 22 if you are at increased risk. You will have tests every 1-10 years, depending on your results and the type of screening test.  Diabetes screening. This is done by checking your blood sugar (glucose) after you have not eaten for a while (fasting). You may have this done every 1-3 years.  Sexually transmitted disease (STD) testing. Follow these instructions at home: Eating and drinking  Eat a diet that includes fresh fruits and vegetables, whole grains, lean protein,  and low-fat dairy products.  Take vitamin and mineral supplements as recommended by your health care provider.  Do not drink alcohol if your health care provider tells you not to drink.  If you drink alcohol: ? Limit how much you have to 0-2 drinks a day. ? Be aware of how much alcohol is in your drink. In the [place] one drink equals one 12 oz bottle of beer (355 mL), one 5 oz glass of wine (148 mL), or one 1 oz glass of hard liquor (44 mL). Lifestyle  Take daily care of your teeth and gums.  Stay active. Exercise for at least 30 minutes on 5 or more days each week.  Do not use any products that contain nicotine or tobacco, such as cigarettes, e-cigarettes, and chewing tobacco. If you need help quitting, ask your health care provider.  If you are sexually active, practice safe sex. Use a condom or other form of protection to prevent STIs (sexually transmitted infections).  Talk with your health care provider about taking a low-dose aspirin every day starting at age 71. What's next?  Go to your health care provider once a year for a well check visit.  Ask your health care provider how often you should have your eyes and teeth checked.  Stay up to date on all vaccines. This information is not intended to replace advice given to you by your health care provider. Make sure you discuss any questions you have with your health care provider. Document Revised: 04/07/2018 Document Reviewed: 04/07/2018 Elsevier Patient Education  2020 [employer].

## 2020-03-19 NOTE — Assessment & Plan Note (Signed)
Referral made to physical therapy for continued low back pain.  If not improving with this will proceed with imaging.

## 2020-03-19 NOTE — Assessment & Plan Note (Signed)
Well adult Orders Placed This Encounter  Procedures  . COMPLETE METABOLIC PANEL WITH GFR  . CBC  . Lipid Profile  . PSA  . Ambulatory referral to Physical Therapy    Referral Priority:   Routine    Referral Type:   Physical Medicine    Referral Reason:   Specialty Services Required    Requested Specialty:   Physical Therapy    Number of Visits Requested:   1  Screening: PSA, Lipid panel.  Defers colonoscopy until October.   Immunizations: UTD Anticipatory guidance/Risk factor reduction:  Recommendations per AVS.

## 2020-08-19 ENCOUNTER — Other Ambulatory Visit: Payer: Self-pay | Admitting: Family Medicine

## 2020-08-19 ENCOUNTER — Telehealth: Payer: Self-pay

## 2020-08-19 DIAGNOSIS — Z1211 Encounter for screening for malignant neoplasm of colon: Secondary | ICD-10-CM

## 2020-08-19 NOTE — Telephone Encounter (Signed)
Patient called requesting Colonoscopy referral due to turning 49.

## 2020-08-19 NOTE — Telephone Encounter (Signed)
LVM advising patient of referral. Expect a call from gastro to schedule.

## 2020-08-19 NOTE — Telephone Encounter (Signed)
Order entered

## 2020-10-17 ENCOUNTER — Other Ambulatory Visit: Payer: Self-pay

## 2020-10-17 ENCOUNTER — Ambulatory Visit (AMBULATORY_SURGERY_CENTER): Payer: BC Managed Care – PPO | Admitting: *Deleted

## 2020-10-17 VITALS — Ht 74.0 in | Wt 210.0 lb

## 2020-10-17 DIAGNOSIS — Z1211 Encounter for screening for malignant neoplasm of colon: Secondary | ICD-10-CM

## 2020-10-17 MED ORDER — PLENVU 140 G PO SOLR: 1.0000 | Freq: Once | ORAL | 0 refills | Status: AC

## 2020-10-17 NOTE — Progress Notes (Signed)
No egg or soy allergy known to patient  No issues with past sedation with any surgeries or procedures Patient denies ever being told they had issues or difficulty with intubation  No FH of Malignant Hyperthermia No diet pills per patient No home 02 use per patient  No blood thinners per patient  Pt denies issues with constipation  No A fib or A flutter  EMMI video to pt or via MyChart  COVID 19 guidelines implemented in [facility] today with Pt and RN  Pt is fully vaccinated  for Covid    Discussed with pt there will be an out-of-pocket cost for prep and that varies from $0 to 70 dollars   Due to the COVID-19 pandemic we are asking patients to follow certain guidelines.  Pt aware of COVID protocols and [facility] guidelines   

## 2020-10-31 ENCOUNTER — Encounter: Payer: Self-pay | Admitting: Gastroenterology

## 2020-11-04 ENCOUNTER — Other Ambulatory Visit: Payer: Self-pay

## 2020-11-04 ENCOUNTER — Encounter: Payer: Self-pay | Admitting: Gastroenterology

## 2020-11-04 ENCOUNTER — Ambulatory Visit (AMBULATORY_SURGERY_CENTER): Payer: BC Managed Care – PPO | Admitting: Gastroenterology

## 2020-11-04 VITALS — BP 111/78 | HR 54 | Temp 98.0°F | Resp 9 | Ht 74.0 in | Wt 210.0 lb

## 2020-11-04 DIAGNOSIS — Z1211 Encounter for screening for malignant neoplasm of colon: Secondary | ICD-10-CM

## 2020-11-04 DIAGNOSIS — D125 Benign neoplasm of sigmoid colon: Secondary | ICD-10-CM | POA: Diagnosis not present

## 2020-11-04 DIAGNOSIS — K640 First degree hemorrhoids: Secondary | ICD-10-CM

## 2020-11-04 MED ORDER — SODIUM CHLORIDE 0.9 % IV SOLN: 500.0000 mL | Freq: Once | INTRAVENOUS | Status: DC

## 2020-11-04 NOTE — Progress Notes (Signed)
Called to room to assist during endoscopic procedure.  Patient ID and intended procedure confirmed with present staff. Received instructions for my participation in the procedure from the performing physician.  

## 2020-11-04 NOTE — Progress Notes (Signed)
C.W. vital signs. 

## 2020-11-04 NOTE — Op Note (Signed)
[place] Patient Name: Anthony Leach Procedure Date: 11/04/2020 10:45 AM MRN: [id number] Endoscopist: Gerrit Heck , MD Age: 50 Referring MD:  Date of Birth: 1970/07/13 Gender: Male Account #: [id number] Procedure:                Colonoscopy Indications:              Screening for colorectal malignant neoplasm, This                            is the patient's first colonoscopy Medicines:                Monitored Anesthesia Care Procedure:                Pre-Anesthesia Assessment:                           - Prior to the procedure, a History and Physical                            was performed, and patient medications and                            allergies were reviewed. The patient's tolerance of                            previous anesthesia was also reviewed. The risks                            and benefits of the procedure and the sedation                            options and risks were discussed with the patient.                            All questions were answered, and informed consent                            was obtained. Prior Anticoagulants: The patient has                            taken no previous anticoagulant or antiplatelet                            agents. ASA Grade Assessment: II - A patient with                            mild systemic disease. After reviewing the risks                            and benefits, the patient was deemed in                            satisfactory condition to undergo the procedure.  After obtaining informed consent, the colonoscope                            was passed under direct vision. Throughout the                            procedure, the patient's blood pressure, pulse, and                            oxygen saturations were monitored continuously. The                            CF-HQ190L was introduced through the anus and                            advanced to the the terminal  ileum. The colonoscopy                            was performed without difficulty. The patient                            tolerated the procedure well. The quality of the                            bowel preparation was good. The terminal ileum,                            ileocecal valve, appendiceal orifice, and rectum                            were photographed. Scope In: 10:53:03 AM Scope Out: 11:12:12 AM Scope Withdrawal Time: 0 hours 16 minutes 11 seconds  Total Procedure Duration: 0 hours 19 minutes 9 seconds  Findings:                 The perianal and digital rectal examinations were                            normal.                           Four sessile polyps were found in the sigmoid                            colon. The polyps were 2 to 4 mm in size. These                            polyps were removed with a cold snare. Resection                            and retrieval were complete. Estimated blood loss                            was minimal.  Non-bleeding internal hemorrhoids were found during                            retroflexion. The hemorrhoids were small.                           The terminal ileum appeared normal. Complications:            No immediate complications. Estimated Blood Loss:     Estimated blood loss was minimal. Impression:               - Four 2 to 4 mm polyps in the sigmoid colon,                            removed with a cold snare. Resected and retrieved.                           - Non-bleeding internal hemorrhoids.                           - The examined portion of the ileum was normal. Recommendation:           - Patient has a contact number available for                            emergencies. The signs and symptoms of potential                            delayed complications were discussed with the                            patient. Return to normal activities tomorrow.                            Written  discharge instructions were provided to the                            patient.                           - Resume previous diet.                           - Continue present medications.                           - Await pathology results.                           - Repeat colonoscopy for surveillance based on                            pathology results.                           - Return to GI office PRN. Gerrit Heck, MD 11/04/2020 11:15:35 AM

## 2020-11-04 NOTE — Progress Notes (Signed)
PT taken to PACU. Monitors in place. VSS. Report given to RN. 

## 2020-11-04 NOTE — Patient Instructions (Signed)
HANDOUTS PROVIDED ON: POLYPS & HEMORRHOIDS  The polyps removed today have been sent for pathology.  The results can take 1-3 weeks to receive.  When your next colonoscopy should occur will be based on the pathology results.    You may resume your previous diet and medication schedule.  Thank you for allowing [place] to care for you today!!!   YOU HAD AN ENDOSCOPIC PROCEDURE TODAY AT [place]:   Refer to the procedure report that was given to you for any specific questions about what was found during the examination.  If the procedure report does not answer your questions, please call your gastroenterologist to clarify.  If you requested that your care partner not be given the details of your procedure findings, then the procedure report has been included in a sealed envelope for you to review at your convenience later.  YOU SHOULD EXPECT: Some feelings of bloating in the abdomen. Passage of more gas than usual.  Walking can help get rid of the air that was put into your GI tract during the procedure and reduce the bloating. If you had a lower endoscopy (such as a colonoscopy or flexible sigmoidoscopy) you may notice spotting of blood in your stool or on the toilet paper. If you underwent a bowel prep for your procedure, you may not have a normal bowel movement for a few days.  Please Note:  You might notice some irritation and congestion in your nose or some drainage.  This is from the oxygen used during your procedure.  There is no need for concern and it should clear up in a day or so.  SYMPTOMS TO REPORT IMMEDIATELY:  Following lower endoscopy (colonoscopy or flexible sigmoidoscopy):  Excessive amounts of blood in the stool  Significant tenderness or worsening of abdominal pains  Swelling of the abdomen that is new, acute  Fever of 100F or higher  For urgent or emergent issues, a gastroenterologist can be reached at any hour by calling [phone]. Do not use MyChart  messaging for urgent concerns.    DIET:  We do recommend a small meal at first, but then you may proceed to your regular diet.  Drink plenty of fluids but you should avoid alcoholic beverages for 24 hours.  ACTIVITY:  You should plan to take it easy for the rest of today and you should NOT DRIVE or use heavy machinery until tomorrow (because of the sedation medicines used during the test).    FOLLOW UP: Our staff will call the number listed on your records Wednesday morning between 7:15 am and 8:15 am to check on you and address any questions or concerns that you may have regarding the information given to you following your procedure. If we do not reach you, we will leave a message.  We will attempt to reach you two times.  During this call, we will ask if you have developed any symptoms of COVID 19. If you develop any symptoms (ie: fever, flu-like symptoms, shortness of breath, cough etc.) before then, please call [phone].  If you test positive for Covid 19 in the 2 weeks post procedure, please call and report this information to [place].    If any biopsies were taken you will be contacted by phone or by letter within the next 1-3 weeks.  Please call us at [phone] if you have not heard about the biopsies in 3 weeks.    SIGNATURES/CONFIDENTIALITY: You and/or your care partner have signed paperwork which  will be entered into your electronic medical record.  These signatures attest to the fact that that the information above on your After Visit Summary has been reviewed and is understood.  Full responsibility of the confidentiality of this discharge information lies with you and/or your care-partner.

## 2020-11-04 NOTE — Progress Notes (Signed)
Pt's states no medical or surgical changes since previsit or office visit. 

## 2020-11-06 ENCOUNTER — Telehealth: Payer: Self-pay

## 2020-11-06 ENCOUNTER — Telehealth: Payer: Self-pay | Admitting: *Deleted

## 2020-11-06 NOTE — Telephone Encounter (Signed)
  Follow up Call-  Call back number 11/04/2020  Post procedure Call Back phone  # [phone]  Permission to leave phone message Yes  Some recent data might be hidden     Patient questions:  Do you have a fever, pain , or abdominal swelling? No. Pain Score  0 *  Have you tolerated food without any problems? Yes.    Have you been able to return to your normal activities? Yes.    Do you have any questions about your discharge instructions: Diet   No. Medications  No. Follow up visit  No.  Do you have questions or concerns about your Care? No.  Actions: * If pain score is 4 or above: No action needed, pain <4.   Have you developed a fever since your procedure? no  2.   Have you had an respiratory symptoms (SOB or cough) since your procedure? no  3.   Have you tested positive for COVID 19 since your procedure no  4.   Have you had any family members/close contacts diagnosed with the COVID 19 since your procedure?  no   If yes to any of these questions please route to Joylene John, RN and Joella Prince, RN

## 2020-11-06 NOTE — Telephone Encounter (Signed)
No answer, left message to call back later today, B.Mohamud Mrozek RN. 

## 2020-11-12 ENCOUNTER — Encounter: Payer: Self-pay | Admitting: Gastroenterology

## 2021-08-27 ENCOUNTER — Ambulatory Visit: Payer: BC Managed Care – PPO | Admitting: Sports Medicine

## 2021-09-24 ENCOUNTER — Ambulatory Visit (INDEPENDENT_AMBULATORY_CARE_PROVIDER_SITE_OTHER): Payer: BC Managed Care – PPO

## 2021-09-24 ENCOUNTER — Ambulatory Visit: Payer: BC Managed Care – PPO | Admitting: Sports Medicine

## 2021-09-24 DIAGNOSIS — M2011 Hallux valgus (acquired), right foot: Secondary | ICD-10-CM

## 2021-09-24 DIAGNOSIS — M170 Bilateral primary osteoarthritis of knee: Secondary | ICD-10-CM

## 2021-09-24 DIAGNOSIS — M722 Plantar fascial fibromatosis: Secondary | ICD-10-CM

## 2021-09-24 MED ORDER — MELOXICAM 15 MG PO TABS: ORAL_TABLET | ORAL | 3 refills | Status: DC

## 2021-09-24 NOTE — Progress Notes (Signed)
    Procedures performed today:    None.  Independent interpretation of notes and tests performed by another provider:   None.  Brief History, Exam, Impression, and Recommendations:    Hallux valgus, right Asymptomatic for now, we discussed the pathophysiology and treatment, if he does develop pain we can consider spacers and first metatarsal ray posting.  Plantar fasciitis, left Pain plantar heel worse in the morning, classic plantar fasciitis, referral for custom molded orthotics, home conditioning given, meloxicam, avoidance of barefoot walking, return to see me in 6 weeks, consideration of night splinting versus air heel brace versus injection if not better.  Primary osteoarthritis of both knees Bilateral knee pain medial joint line mild gelling. He is a runner, 9 miles per week, typically asphalt surfaces. All classic symptoms for osteoarthritis, minimal effusion, exam otherwise unremarkable. Weak hip abductor's. Adding x-rays, meloxicam as above, home conditioning, hip abductor conditioning, return to see me in 6 weeks, we will consider viscosupplementation if not better.    ___________________________________________ Gwen Her. Dianah Field, M.D., ABFM., CAQSM. Primary Care and [place] Instructor of [place] of [facility] of [employer]

## 2021-09-24 NOTE — Patient Instructions (Signed)
Hip Rehabilitation Protocol:  1.  Side leg raises.  3x30 with no weight, then 3x15 with 2 lb ankle weight, then 3x15 with 5 lb ankle weight 2.  Standing hip rotation.  3x30 with no weight, then 3x15 with 2 lb ankle weight, then 3x15 with 5 lb ankle weight. 3.  Side step ups.  3x30 with no weight, then 3x15 with 5 lbs in backpack, then 3x15 with 10 lbs in backpack. 

## 2021-09-24 NOTE — Assessment & Plan Note (Signed)
Asymptomatic for now, we discussed the pathophysiology and treatment, if he does develop pain we can consider spacers and first metatarsal ray posting.

## 2021-09-24 NOTE — Assessment & Plan Note (Signed)
Bilateral knee pain medial joint line mild gelling. He is a runner, 9 miles per week, typically asphalt surfaces. All classic symptoms for osteoarthritis, minimal effusion, exam otherwise unremarkable. Weak hip abductor's. Adding x-rays, meloxicam as above, home conditioning, hip abductor conditioning, return to see me in 6 weeks, we will consider viscosupplementation if not better.

## 2021-09-24 NOTE — Assessment & Plan Note (Signed)
Pain plantar heel worse in the morning, classic plantar fasciitis, referral for custom molded orthotics, home conditioning given, meloxicam, avoidance of barefoot walking, return to see me in 6 weeks, consideration of night splinting versus air heel brace versus injection if not better.

## 2021-09-29 ENCOUNTER — Ambulatory Visit (INDEPENDENT_AMBULATORY_CARE_PROVIDER_SITE_OTHER): Payer: BC Managed Care – PPO | Admitting: Family Medicine

## 2021-09-29 ENCOUNTER — Encounter: Payer: Self-pay | Admitting: Family Medicine

## 2021-09-29 VITALS — BP 118/75 | HR 54 | Ht 74.0 in | Wt 213.0 lb

## 2021-09-29 DIAGNOSIS — Z0000 Encounter for general adult medical examination without abnormal findings: Secondary | ICD-10-CM | POA: Diagnosis not present

## 2021-09-29 DIAGNOSIS — Z13220 Encounter for screening for lipoid disorders: Secondary | ICD-10-CM | POA: Diagnosis not present

## 2021-09-29 MED ORDER — TADALAFIL 20 MG PO TABS: 10.0000 mg | ORAL_TABLET | ORAL | 6 refills | Status: DC | PRN

## 2021-09-29 NOTE — Assessment & Plan Note (Signed)
Well adult Orders Placed This Encounter  Procedures  . COMPLETE METABOLIC PANEL WITH GFR  . CBC with Differential  . Lipid Panel w/reflex Direct LDL  . PSA  Screening: Per lab orders Immunizations:  He will have Shingrix completed at local pharmacy.  Anticipatory guidance/Risk factor Reduction:  Recommendations per AVS.

## 2021-09-29 NOTE — Patient Instructions (Signed)

## 2021-09-29 NOTE — Progress Notes (Signed)
Anthony Leach - 51 y.o. male MRN [id number]  Date of birth: 06/15/1970  Subjective No chief complaint on file.   HPI Anthony Leach is a 51 y.o. male here today for annual exam.  Reports that he is doing well at this time. Has a couple skin lesions he would like me to look at .    Stays pretty active.  Feels like diet is pretty good.   He is a non-smoker.  Has a couple of throughout the week.   He plans to get shingrix at local pharmacy.   Review of Systems  Constitutional:  Negative for chills, fever, malaise/fatigue and weight loss.  HENT:  Negative for congestion, ear pain and sore throat.   Eyes:  Negative for blurred vision, double vision and pain.  Respiratory:  Negative for cough and shortness of breath.   Cardiovascular:  Negative for chest pain and palpitations.  Gastrointestinal:  Negative for abdominal pain, blood in stool, constipation, heartburn and nausea.  Genitourinary:  Negative for dysuria and urgency.  Musculoskeletal:  Negative for joint pain and myalgias.  Neurological:  Negative for dizziness and headaches.  Endo/Heme/Allergies:  Does not bruise/bleed easily.  Psychiatric/Behavioral:  Negative for depression. The patient is not nervous/anxious and does not have insomnia.    No Known Allergies  Past Medical History:  Diagnosis Date   Ganglioneuroma    Inguinal hernia     Past Surgical History:  Procedure Laterality Date   HERNIA REPAIR     THORACOTOMY  1991   Removal of benign mass in Chest area   VASECTOMY      Social History   Socioeconomic History   Marital status: Married    Spouse name: Not on file   Number of children: Not on file   Years of education: Not on file   Highest education level: Not on file  Occupational History    Employer: [employer]  Tobacco Use   Smoking status: Former    Types: Cigarettes    Quit date: 1990    Years since quitting: 33.4   Smokeless tobacco: Never  Vaping Use   Vaping Use: Never used   Substance and Sexual Activity   Alcohol use: Yes    Comment: 20 DRINKS/WK   Drug use: Never   Sexual activity: Yes    Partners: Female    Birth control/protection: None  Other Topics Concern   Not on file  Social History Narrative   Not on file   Social Determinants of Health   Financial Resource Strain: Not on file  Food Insecurity: Not on file  Transportation Needs: Not on file  Physical Activity: Not on file  Stress: Not on file  Social Connections: Not on file    Family History  Problem Relation Age of Onset   Diabetes Mother    High blood pressure Father    Colon polyps Neg Hx    Colon cancer Neg Hx    Esophageal cancer Neg Hx    Stomach cancer Neg Hx    Rectal cancer Neg Hx     Health Maintenance  Topic Date Due   HIV Screening  Never done   Hepatitis C Screening  Never done   Zoster Vaccines- Shingrix (1 of 2) Never done   COVID-19 Vaccine (4 - Booster for Pfizer series) 04/15/2020   INFLUENZA VACCINE  11/25/2021   TETANUS/TDAP  10/14/2027   COLONOSCOPY (Pts 45-32yr Insurance coverage will need to be confirmed)  11/05/2027  HPV VACCINES  Aged Out     ----------------------------------------------------------------------------------------------------------------------------------------------------------------------------------------------------------------- Physical Exam There were no vitals taken for this visit.  Physical Exam Constitutional:      General: He is not in acute distress. HENT:     Head: Normocephalic and atraumatic.     Right Ear: Tympanic membrane and external ear normal.     Left Ear: Tympanic membrane and external ear normal.  Eyes:     General: No scleral icterus. Neck:     Thyroid: No thyromegaly.  Cardiovascular:     Rate and Rhythm: Normal rate and regular rhythm.     Heart sounds: Normal heart sounds.  Pulmonary:     Effort: Pulmonary effort is normal.     Breath sounds: Normal breath sounds.  Abdominal:     General:  Bowel sounds are normal. There is no distension.     Palpations: Abdomen is soft.     Tenderness: There is no abdominal tenderness. There is no guarding.  Musculoskeletal:     Cervical back: Normal range of motion.  Lymphadenopathy:     Cervical: No cervical adenopathy.  Skin:    General: Skin is warm and dry.     Findings: No rash.     Comments: A few scattered seborrheic keratoses on back and cherry angioma on abdominal wall.   Neurological:     Mental Status: He is alert and oriented to person, place, and time.     Cranial Nerves: No cranial nerve deficit.     Motor: No abnormal muscle tone.  Psychiatric:        Mood and Affect: Mood normal.        Behavior: Behavior normal.    ------------------------------------------------------------------------------------------------------------------------------------------------------------------------------------------------------------------- Assessment and Plan  Well adult exam Well adult Orders Placed This Encounter  Procedures   COMPLETE METABOLIC PANEL WITH GFR   CBC with Differential   Lipid Panel w/reflex Direct LDL   PSA  Screening: Per lab orders Immunizations:  He will have Shingrix completed at local pharmacy.  Anticipatory guidance/Risk factor Reduction:  Recommendations per AVS.     No orders of the defined types were placed in this encounter.   No follow-ups on file.    This visit occurred during the SARS-CoV-2 public health emergency.  Safety protocols were in place, including screening questions prior to the visit, additional usage of staff PPE, and extensive cleaning of exam room while observing appropriate contact time as indicated for disinfecting solutions.

## 2021-10-02 LAB — CBC WITH DIFFERENTIAL/PLATELET
Absolute Monocytes: 357 cells/uL (ref 200–950)
Basophils Absolute: 31 cells/uL (ref 0–200)
Basophils Relative: 0.6 %
Eosinophils Absolute: 219 cells/uL (ref 15–500)
Eosinophils Relative: 4.3 %
HCT: 44.6 % (ref 38.5–50.0)
Hemoglobin: 14.5 g/dL (ref 13.2–17.1)
Lymphs Abs: 1698 cells/uL (ref 850–3900)
MCH: 30.3 pg (ref 27.0–33.0)
MCHC: 32.5 g/dL (ref 32.0–36.0)
MCV: 93.1 fL (ref 80.0–100.0)
MPV: 11.2 fL (ref 7.5–12.5)
Monocytes Relative: 7 %
Neutro Abs: 2795 cells/uL (ref 1500–7800)
Neutrophils Relative %: 54.8 %
Platelets: 233 10*3/uL (ref 140–400)
RBC: 4.79 10*6/uL (ref 4.20–5.80)
RDW: 12.4 % (ref 11.0–15.0)
Total Lymphocyte: 33.3 %
WBC: 5.1 10*3/uL (ref 3.8–10.8)

## 2021-10-02 LAB — COMPLETE METABOLIC PANEL WITH GFR
AG Ratio: 1.8 (calc) (ref 1.0–2.5)
ALT: 20 U/L (ref 9–46)
AST: 19 U/L (ref 10–35)
Albumin: 4.4 g/dL (ref 3.6–5.1)
Alkaline phosphatase (APISO): 54 U/L (ref 35–144)
BUN: 15 mg/dL (ref 7–25)
CO2: 29 mmol/L (ref 20–32)
Calcium: 9.5 mg/dL (ref 8.6–10.3)
Chloride: 104 mmol/L (ref 98–110)
Creat: 0.88 mg/dL (ref 0.70–1.30)
Globulin: 2.4 g/dL (calc) (ref 1.9–3.7)
Glucose, Bld: 84 mg/dL (ref 65–99)
Potassium: 5 mmol/L (ref 3.5–5.3)
Sodium: 140 mmol/L (ref 135–146)
Total Bilirubin: 0.6 mg/dL (ref 0.2–1.2)
Total Protein: 6.8 g/dL (ref 6.1–8.1)
eGFR: 104 mL/min/{1.73_m2} (ref >=60)

## 2021-10-02 LAB — PSA: PSA: 0.96 ng/mL (ref <=4.00)

## 2021-10-02 LAB — LIPID PANEL W/REFLEX DIRECT LDL
Cholesterol: 205 mg/dL — ABNORMAL HIGH (ref <200)
HDL: 76 mg/dL (ref >=40)
LDL Cholesterol (Calc): 110 mg/dL (calc) — ABNORMAL HIGH
Non-HDL Cholesterol (Calc): 129 mg/dL (calc) (ref <130)
Total CHOL/HDL Ratio: 2.7 (calc) (ref <5.0)
Triglycerides: 96 mg/dL (ref <150)

## 2021-10-07 ENCOUNTER — Encounter: Payer: Self-pay | Admitting: Family Medicine

## 2021-10-07 ENCOUNTER — Ambulatory Visit: Payer: BC Managed Care – PPO | Admitting: Family Medicine

## 2021-10-07 DIAGNOSIS — M722 Plantar fascial fibromatosis: Secondary | ICD-10-CM

## 2021-10-07 NOTE — Assessment & Plan Note (Signed)
Acutely occurring.  Pain seems most consistent with Planter fasciitis. -Counseled on home exercise therapy and supportive care. -Orthotics.

## 2021-10-07 NOTE — Progress Notes (Signed)
  Anthony Leach - 51 y.o. male MRN [id number]  Date of birth: 1971-03-05  SUBJECTIVE:  Including CC & ROS.  No chief complaint on file.   Anthony Leach is a 51 y.o. male that is presenting with left foot pain.  He is a runner and does notice pain intermittently.  No history of surgery.   Review of Systems See HPI   HISTORY: Past Medical, Surgical, Social, and Family History Reviewed & Updated per EMR.   Pertinent Historical Findings include:  Past Medical History:  Diagnosis Date   Ganglioneuroma    Inguinal hernia     Past Surgical History:  Procedure Laterality Date   HERNIA REPAIR     THORACOTOMY  1991   Removal of benign mass in Chest area   VASECTOMY       PHYSICAL EXAM:  VS: Ht '6\' 2"'$  (1.88 m)   Wt 213 lb (96.6 kg)   BMI 27.35 kg/m  Physical Exam Gen: NAD, alert, cooperative with exam, well-appearing MSK:  Neurovascularly intact    Patient was fitted for a standard, cushioned, semi-rigid orthotic. The orthotic was heated and afterward the patient stood on the orthotic blank positioned on the orthotic stand. The patient was positioned in subtalar neutral position and 10 degrees of ankle dorsiflexion in a weight bearing stance. After completion of molding, a stable base was applied to the orthotic blank. The blank was ground to a stable position for weight bearing. Size: 12 Pairs: 2 Base: Blue EVA Additional Posting and Padding: None The patient ambulated these, and they were very comfortable.   ASSESSMENT & PLAN:   Plantar fasciitis, left Acutely occurring.  Pain seems most consistent with Planter fasciitis. -Counseled on home exercise therapy and supportive care. -Orthotics.

## 2021-10-23 ENCOUNTER — Encounter: Payer: Self-pay | Admitting: Sports Medicine

## 2021-11-10 ENCOUNTER — Ambulatory Visit: Payer: BC Managed Care – PPO | Admitting: Sports Medicine

## 2021-11-10 ENCOUNTER — Encounter: Payer: Self-pay | Admitting: Family Medicine

## 2021-11-10 ENCOUNTER — Ambulatory Visit (INDEPENDENT_AMBULATORY_CARE_PROVIDER_SITE_OTHER): Payer: BC Managed Care – PPO | Admitting: Family Medicine

## 2021-11-10 DIAGNOSIS — M722 Plantar fascial fibromatosis: Secondary | ICD-10-CM

## 2021-11-10 DIAGNOSIS — M170 Bilateral primary osteoarthritis of knee: Secondary | ICD-10-CM | POA: Diagnosis not present

## 2021-11-10 NOTE — Assessment & Plan Note (Signed)
Anthony Leach returns, he is a very pleasant 51 year old male runner, typically 9 miles per week, asphalt surfaces, x-rays did confirm osteoarthritis. Hip abductor's were weak, he has been working aggressive on his abductors and knee stabilization. Knee pain has improved dramatically with this and meloxicam. We did discuss continuing conditioning, and also changing his striking pattern from heel to midfoot to forefoot and back throughout the run if he develops pains. I also gave him information on viscosupplementation. Return as needed.

## 2021-11-10 NOTE — Progress Notes (Signed)
    Procedures performed today:    None.  Independent interpretation of notes and tests performed by another provider:   None.  Brief History, Exam, Impression, and Recommendations:    Primary osteoarthritis of both knees Marya Amsler returns, he is a very pleasant 51 year old male runner, typically 9 miles per week, asphalt surfaces, x-rays did confirm osteoarthritis. Hip abductor's were weak, he has been working aggressive on his abductors and knee stabilization. Knee pain has improved dramatically with this and meloxicam. We did discuss continuing conditioning, and also changing his striking pattern from heel to midfoot to forefoot and back throughout the run if he develops pains. I also gave him information on viscosupplementation. Return as needed.   Plantar fasciitis, left Had some good initial improvements and then some worsening of pain, starting to improve again, his orthotics are little bit thick anterior arch and midfoot and he is going to see Dr. Raeford Razor to have these modified to some degree today.    ____________________________________________ Gwen Her. Dianah Field, M.D., ABFM., CAQSM., AME. Primary Care and Sports Medicine [place] [facility]  Adjunct Professor of [place] [employer] [facility] of Medicine  Risk manager

## 2021-11-10 NOTE — Progress Notes (Signed)
  TIMMY BUBECK - 51 y.o. male MRN [id number]  Date of birth: 17-Apr-1971  SUBJECTIVE:  Including CC & ROS.  No chief complaint on file.   Anthony Leach is a 51 y.o. male that is following up for his orthotics.    Review of Systems See HPI   HISTORY: Past Medical, Surgical, Social, and Family History Reviewed & Updated per EMR.   Pertinent Historical Findings include:  Past Medical History:  Diagnosis Date   Ganglioneuroma    Inguinal hernia     Past Surgical History:  Procedure Laterality Date   HERNIA REPAIR     THORACOTOMY  1991   Removal of benign mass in Chest area   VASECTOMY       PHYSICAL EXAM:  VS: There were no vitals taken for this visit. Physical Exam Gen: NAD, alert, cooperative with exam, well-appearing MSK:  Neurovascularly intact       ASSESSMENT & PLAN:   Plantar fasciitis, left Shaved down a portion of his orthotic.

## 2021-11-10 NOTE — Assessment & Plan Note (Signed)
Had some good initial improvements and then some worsening of pain, starting to improve again, his orthotics are little bit thick anterior arch and midfoot and he is going to see Dr. Raeford Razor to have these modified to some degree today.

## 2021-11-10 NOTE — Assessment & Plan Note (Signed)
Shaved down a portion of his orthotic.

## 2021-11-12 ENCOUNTER — Other Ambulatory Visit: Payer: Self-pay | Admitting: Family Medicine

## 2021-11-14 ENCOUNTER — Encounter: Payer: Self-pay | Admitting: Family Medicine

## 2021-12-10 ENCOUNTER — Emergency Department (HOSPITAL_BASED_OUTPATIENT_CLINIC_OR_DEPARTMENT_OTHER): Payer: BC Managed Care – PPO

## 2021-12-10 ENCOUNTER — Other Ambulatory Visit: Payer: Self-pay

## 2021-12-10 ENCOUNTER — Encounter (HOSPITAL_BASED_OUTPATIENT_CLINIC_OR_DEPARTMENT_OTHER): Payer: Self-pay | Admitting: Emergency Medicine

## 2021-12-10 ENCOUNTER — Emergency Department (HOSPITAL_BASED_OUTPATIENT_CLINIC_OR_DEPARTMENT_OTHER)
Admission: EM | Admit: 2021-12-10 | Discharge: 2021-12-10 | Disposition: A | Discharge status: Other Acute Inpatient Hospital | Payer: BC Managed Care – PPO | Attending: Emergency Medicine | Admitting: Emergency Medicine

## 2021-12-10 DIAGNOSIS — R1013 Epigastric pain: Secondary | ICD-10-CM | POA: Diagnosis present

## 2021-12-10 DIAGNOSIS — R059 Cough, unspecified: Secondary | ICD-10-CM | POA: Insufficient documentation

## 2021-12-10 DIAGNOSIS — R109 Unspecified abdominal pain: Secondary | ICD-10-CM

## 2021-12-10 LAB — URINALYSIS, ROUTINE W REFLEX MICROSCOPIC
Bilirubin Urine: NEGATIVE
Glucose, UA: NEGATIVE mg/dL
Hgb urine dipstick: NEGATIVE
Ketones, ur: NEGATIVE mg/dL
Leukocytes,Ua: NEGATIVE
Nitrite: NEGATIVE
Protein, ur: NEGATIVE mg/dL
Specific Gravity, Urine: 1.01 (ref 1.005–1.030)
pH: 6 (ref 5.0–8.0)

## 2021-12-10 LAB — CBC WITH DIFFERENTIAL/PLATELET
Abs Immature Granulocytes: 0.04 10*3/uL (ref 0.00–0.07)
Basophils Absolute: 0 10*3/uL (ref 0.0–0.1)
Basophils Relative: 0 %
Eosinophils Absolute: 0.1 10*3/uL (ref 0.0–0.5)
Eosinophils Relative: 1 %
HCT: 42.9 % (ref 39.0–52.0)
Hemoglobin: 14.7 g/dL (ref 13.0–17.0)
Immature Granulocytes: 0 %
Lymphocytes Relative: 13 %
Lymphs Abs: 1.2 10*3/uL (ref 0.7–4.0)
MCH: 30.8 pg (ref 26.0–34.0)
MCHC: 34.3 g/dL (ref 30.0–36.0)
MCV: 89.7 fL (ref 80.0–100.0)
Monocytes Absolute: 0.7 10*3/uL (ref 0.1–1.0)
Monocytes Relative: 8 %
Neutro Abs: 7.2 10*3/uL (ref 1.7–7.7)
Neutrophils Relative %: 78 %
Platelets: 220 10*3/uL (ref 150–400)
RBC: 4.78 MIL/uL (ref 4.22–5.81)
RDW: 13.1 % (ref 11.5–15.5)
WBC: 9.2 10*3/uL (ref 4.0–10.5)
nRBC: 0 % (ref 0.0–0.2)

## 2021-12-10 LAB — TROPONIN I (HIGH SENSITIVITY): Troponin I (High Sensitivity): <2 ng/L (ref <18)

## 2021-12-10 LAB — COMPREHENSIVE METABOLIC PANEL
ALT: 24 U/L (ref 0–44)
AST: 20 U/L (ref 15–41)
Albumin: 4.3 g/dL (ref 3.5–5.0)
Alkaline Phosphatase: 66 U/L (ref 38–126)
Anion gap: 11 (ref 5–15)
BUN: 16 mg/dL (ref 6–20)
CO2: 25 mmol/L (ref 22–32)
Calcium: 9.1 mg/dL (ref 8.9–10.3)
Chloride: 101 mmol/L (ref 98–111)
Creatinine, Ser: 0.86 mg/dL (ref 0.61–1.24)
GFR, Estimated: >60 mL/min (ref >60)
Glucose, Bld: 107 mg/dL — ABNORMAL HIGH (ref 70–99)
Potassium: 3.8 mmol/L (ref 3.5–5.1)
Sodium: 137 mmol/L (ref 135–145)
Total Bilirubin: 1 mg/dL (ref 0.3–1.2)
Total Protein: 7.8 g/dL (ref 6.5–8.1)

## 2021-12-10 NOTE — ED Triage Notes (Addendum)
Pt reports he had CP, which he attributed to being bloated, this a.m. He was able to belch and pass gas and the CP subsided, but now he has sharp RT flank pain that worsens with deep breath

## 2021-12-10 NOTE — ED Provider Notes (Signed)
[facility] EMERGENCY DEPARTMENT Provider Note   CSN: [id number] Arrival date & time: 12/10/21  1325     History  Chief Complaint  Patient presents with   Back Pain    Anthony Leach is a 51 y.o. male.  51 yo M with a chief complaints of right CVA tenderness.  This been going on for couple days.  Worse with coughing and deep breathing.  Denies trauma to the area.  Denies worsening with twisting and turning denies radiation of the pain.  Initially felt more bloated than anything else and maybe had some epigastric discomfort.  He was able to belch and move his bowels and that pain went away but felt it moved into the right flank.  Has been able to eat and drink.  No urinary symptoms.   Back Pain      Home Medications Prior to Admission medications   Medication Sig Start Date End Date Taking? Authorizing Provider  loratadine (CLARITIN) 10 MG tablet Take 10 mg by mouth daily.    [provider]  meloxicam (MOBIC) 15 MG tablet One tab PO every 24 hours with a meal for 2 weeks, then once every 24 hours prn pain. 09/24/21   Silverio Decamp, MD  Multiple Vitamin (MULTIVITAMIN WITH MINERALS) TABS tablet Take 1 tablet by mouth daily.    [provider]  tadalafil (CIALIS) 20 MG tablet TAKE 1/2 TO 1 TABLET BY MOUTH EVERY OTHER DAY AS NEEDED FOR ERECTILE DYSFUNCTION 11/12/21   Luetta Nutting, DO      Allergies    Patient has no known allergies.    Review of Systems   Review of Systems  Musculoskeletal:  Positive for back pain.    Physical Exam Updated Vital Signs BP (!) 158/96   Pulse 72   Temp 98.4 F (36.9 C) (Oral)   Resp 17   Ht '6\' 2"'$  (1.88 m)   Wt 95.3 kg   SpO2 98%   BMI 26.96 kg/m  Physical Exam Vitals and nursing note reviewed.  Constitutional:      Appearance: He is well-developed.  HENT:     Head: Normocephalic and atraumatic.  Eyes:     Pupils: Pupils are equal, round, and reactive to light.  Neck:     Vascular: No JVD.   Cardiovascular:     Rate and Rhythm: Normal rate and regular rhythm.     Heart sounds: No murmur heard.    No friction rub. No gallop.  Pulmonary:     Effort: No respiratory distress.     Breath sounds: No wheezing.  Abdominal:     General: There is no distension.     Tenderness: There is no abdominal tenderness. There is no guarding or rebound.  Musculoskeletal:        General: Normal range of motion.     Cervical back: Normal range of motion and neck supple.     Comments: Points to his right CVA.  No rash no tenderness no midline spinal tenderness step-offs or deformities.  Skin:    Coloration: Skin is not pale.     Findings: No rash.  Neurological:     Mental Status: He is alert and oriented to person, place, and time.  Psychiatric:        Behavior: Behavior normal.     ED Results / Procedures / Treatments   Labs (all labs ordered are listed, but only abnormal results are displayed) Labs Reviewed  COMPREHENSIVE METABOLIC PANEL - Abnormal;  Notable for the following components:      Result Value   Glucose, Bld 107 (*)    All other components within normal limits  URINALYSIS, ROUTINE W REFLEX MICROSCOPIC  CBC WITH DIFFERENTIAL/PLATELET  TROPONIN I (HIGH SENSITIVITY)  TROPONIN I (HIGH SENSITIVITY)    EKG EKG Interpretation  Date/Time:  Wednesday December 10 2021 13:46:54 EDT Ventricular Rate:  87 PR Interval:  166 QRS Duration: 86 QT Interval:  342 QTC Calculation: 411 R Axis:   79 Text Interpretation: Normal sinus rhythm Cannot rule out Anterior infarct , age undetermined Abnormal ECG No old tracing to compare Confirmed by Deno Etienne [phone]) on 12/10/2021 3:30:48 PM  Radiology CT Renal Stone Study  Result Date: 12/10/2021 CLINICAL DATA:  Hervey Ard right flank pain, kidney stone suspected EXAM: CT ABDOMEN AND PELVIS WITHOUT CONTRAST TECHNIQUE: Multidetector CT imaging of the abdomen and pelvis was performed following the standard protocol without IV contrast. RADIATION  DOSE REDUCTION: This exam was performed according to the departmental dose-optimization program which includes automated exposure control, adjustment of the mA and/or kV according to patient size and/or use of iterative reconstruction technique. COMPARISON:  None Available. FINDINGS: Lower chest: No acute abnormality. Hepatobiliary: No suspicious focal liver abnormality is seen. No gallstones, gallbladder wall thickening, or biliary dilatation. Pancreas: Unremarkable. No pancreatic ductal dilatation or surrounding inflammatory changes. Spleen: Normal in size without focal abnormality. Adrenals/Urinary Tract: Adrenal glands are unremarkable. Kidneys are normal, without renal calculi, suspicious focal lesion, or hydronephrosis. Bladder is unremarkable. Stomach/Bowel: Stomach is within normal limits. The appendix is normal. No evidence of bowel wall thickening, distention, or inflammatory changes. Vascular/Lymphatic: Aortic atherosclerosis. No enlarged abdominal or pelvic lymph nodes. Reproductive: Unremarkable. Other: No free intraperitoneal fluid or air. Musculoskeletal: No acute or significant osseous findings. IMPRESSION: No acute abnormality in the abdomen or pelvis. Aortic Atherosclerosis (ICD10-I70.0). Electronically Signed   By: Placido Sou M.D.   On: 12/10/2021 17:04    Procedures Procedures    Medications Ordered in ED Medications - No data to display  ED Course/ Medical Decision Making/ A&P                           Medical Decision Making Amount and/or Complexity of Data Reviewed Labs: ordered. Radiology: ordered.   51 yo M with a chief complaints of right flank pain.  This has been going on for about a day.  Started by feeling like he was bloated and then felt like the pain now moved to the right flank.  He has no urinary symptoms no history of kidney stones.  Denies trauma denies any pain with movement or palpation but is worse with deep breathing.  We will obtain a CT stone study.   Lab work without anemia no leukocytosis UA without hematuria or infection is independently interpreted by me.  Troponin negative.  CT stone study negative for intra-abdominal pathology.  On my independent interpretation I see no signs of infiltrate to the right lower lung either.  We will discharge patient home.  She is musculoskeletal.  PCP follow up.  5:16 PM:  I have discussed the diagnosis/risks/treatment options with the patient.  Evaluation and diagnostic testing in the emergency department does not suggest an emergent condition requiring admission or immediate intervention beyond what has been performed at this time.  They will follow up with  PCP. We also discussed returning to the ED immediately if new or worsening sx occur. We discussed the sx which are  most concerning (e.g., sudden worsening pain, fever, inability to tolerate by mouth) that necessitate immediate return. Medications administered to the patient during their visit and any new prescriptions provided to the patient are listed below.  Medications given during this visit Medications - No data to display   The patient appears reasonably screen and/or stabilized for discharge and I doubt any other medical condition or other [facility] requiring further screening, evaluation, or treatment in the ED at this time prior to discharge.          Final Clinical Impression(s) / ED Diagnoses Final diagnoses:  Right flank pain    Rx / DC Orders ED Discharge Orders     None         Deno Etienne, DO 12/10/21 1716

## 2021-12-10 NOTE — Discharge Instructions (Signed)
Take 4 over the counter ibuprofen tablets 3 times a day or 2 over-the-counter naproxen tablets twice a day for pain. Also take tylenol 1000mg(2 extra strength) four times a day.    

## 2021-12-12 ENCOUNTER — Telehealth: Payer: Self-pay | Admitting: General Practice

## 2021-12-12 NOTE — Telephone Encounter (Signed)
Transition Care Management Follow-up Telephone Call Date of discharge and from where: 12/10/21 from [facility] med center How have you been since you were released from the hospital? Patient is feeling a litter better but still has some pain. He is currently out of town. Virtual appt scheduled for Tuesday with PCP. Any questions or concerns? No  Items Reviewed: Did the pt receive and understand the discharge instructions provided? Yes  Medications obtained and verified? Yes  Other? No  Any new allergies since your discharge? No  Dietary orders reviewed? Yes Do you have support at home? Yes   Home Care and Equipment/Supplies: Were home health services ordered? no  Functional Questionnaire: (I = Independent and D = Dependent) ADLs: I  Bathing/Dressing- I  Meal Prep- I  Eating- I  Maintaining continence- I  Transferring/Ambulation- I  Managing Meds- I  Follow up appointments reviewed:  PCP Hospital f/u appt confirmed? Yes  Scheduled to see Dr. Zigmund Daniel on 12/16/21 @ 1130. [facility] f/u appt confirmed? No   Are transportation arrangements needed? No  If their condition worsens, is the pt aware to call PCP or go to the Emergency Dept.? Yes Was the patient provided with contact information for the PCP's office or ED? Yes Was to pt encouraged to call back with questions or concerns? No

## 2021-12-16 ENCOUNTER — Telehealth (INDEPENDENT_AMBULATORY_CARE_PROVIDER_SITE_OTHER): Payer: BC Managed Care – PPO | Admitting: Family Medicine

## 2021-12-16 ENCOUNTER — Encounter: Payer: Self-pay | Admitting: Family Medicine

## 2021-12-16 DIAGNOSIS — R109 Unspecified abdominal pain: Secondary | ICD-10-CM | POA: Diagnosis not present

## 2021-12-16 NOTE — Assessment & Plan Note (Signed)
Resolved at this point.  Possibly MSK in nature.  He will let me know if symptoms return.

## 2021-12-16 NOTE — Progress Notes (Signed)
Anthony Leach - 51 y.o. male MRN [id number]  Date of birth: 1970/11/07   This visit type was conducted due to national recommendations for restrictions regarding the COVID-19 Pandemic (e.g. social distancing).  This format is felt to be most appropriate for this patient at this time.  All issues noted in this document were discussed and addressed.  No physical exam was performed (except for noted visual exam findings with Video Visits).  I discussed the limitations of evaluation and management by telemedicine and the availability of in person appointments. The patient expressed understanding and agreed to proceed.  I connected withNAME@ on 12/16/21 at 11:30 AM EDT by a video enabled telemedicine application and verified that I am speaking with the correct person using two identifiers.  Present at visit: Luetta Nutting, [place]   Patient Location: Home  [place] [address]   Provider location:   [facility]  Chief Complaint  Patient presents with   Follow-up    Flank pain    HPI  Anthony Leach is a 51 y.o. male who presents via audio/video conferencing for a telehealth visit today.  He was seen in the ED recently with complaint of flank pain.  He had decreased appetite associated with this.  Labs and CT scan in the ED did not show any significant abnormality.  Reports that pain intermittently got better and has now completely resolved.  Appetite is normal and bowels are moving normally.  Denies urinary symptoms.    ROS:  A comprehensive ROS was completed and negative except as noted per HPI  Past Medical History:  Diagnosis Date   Ganglioneuroma    Inguinal hernia     Past Surgical History:  Procedure Laterality Date   HERNIA REPAIR     THORACOTOMY  1991   Removal of benign mass in Chest area   VASECTOMY      Family History  Problem Relation Age of Onset   Diabetes Mother    High blood pressure Father    Colon polyps Neg Hx    Colon cancer Neg  Hx    Esophageal cancer Neg Hx    Stomach cancer Neg Hx    Rectal cancer Neg Hx     Social History   Socioeconomic History   Marital status: Married    Spouse name: Not on file   Number of children: Not on file   Years of education: Not on file   Highest education level: Not on file  Occupational History    Employer: [employer]  Tobacco Use   Smoking status: Former    Types: Cigarettes    Quit date: 1990    Years since quitting: 33.6   Smokeless tobacco: Never  Vaping Use   Vaping Use: Never used  Substance and Sexual Activity   Alcohol use: Yes    Comment: 20 DRINKS/WK   Drug use: Never   Sexual activity: Yes    Partners: Female    Birth control/protection: None  Other Topics Concern   Not on file  Social History Narrative   Not on file   Social Determinants of Health   Financial Resource Strain: Not on file  Food Insecurity: Not on file  Transportation Needs: Not on file  Physical Activity: Not on file  Stress: Not on file  Social Connections: Not on file  Intimate Partner Violence: Not on file     Current Outpatient Medications:    loratadine (CLARITIN) 10 MG  tablet, Take 10 mg by mouth daily., Disp: , Rfl:    meloxicam (MOBIC) 15 MG tablet, One tab PO every 24 hours with a meal for 2 weeks, then once every 24 hours prn pain., Disp: 30 tablet, Rfl: 3   Multiple Vitamin (MULTIVITAMIN WITH MINERALS) TABS tablet, Take 1 tablet by mouth daily., Disp: , Rfl:    tadalafil (CIALIS) 20 MG tablet, TAKE 1/2 TO 1 TABLET BY MOUTH EVERY OTHER DAY AS NEEDED FOR ERECTILE DYSFUNCTION, Disp: 15 tablet, Rfl: 6  EXAM:  VITALS per patient if applicable: There were no vitals taken for this visit.  GENERAL: alert, oriented, appears well and in no acute distress  HEENT: atraumatic, conjunttiva clear, no obvious abnormalities on inspection of external nose and ears  NECK: normal movements of the head and neck  LUNGS: on inspection no signs of respiratory  distress, breathing rate appears normal, no obvious gross SOB, gasping or wheezing  CV: no obvious cyanosis  MS: moves all visible extremities without noticeable abnormality  PSYCH/NEURO: pleasant and cooperative, no obvious depression or anxiety, speech and thought processing grossly intact  ASSESSMENT AND PLAN:  Discussed the following assessment and plan:  Flank pain Resolved at this point.  Possibly MSK in nature.  He will let me know if symptoms return.      I discussed the assessment and treatment plan with the patient. The patient was provided an opportunity to ask questions and all were answered. The patient agreed with the plan and demonstrated an understanding of the instructions.   The patient was advised to call back or seek an in-person evaluation if the symptoms worsen or if the condition fails to improve as anticipated.    Luetta Nutting, DO

## 2022-08-10 ENCOUNTER — Encounter: Payer: Self-pay | Admitting: *Deleted

## 2022-10-07 ENCOUNTER — Ambulatory Visit: Payer: BC Managed Care – PPO | Admitting: Family Medicine

## 2022-10-07 ENCOUNTER — Encounter: Payer: Self-pay | Admitting: Family Medicine

## 2022-10-07 VITALS — BP 110/67 | HR 59 | Ht 74.0 in | Wt 204.0 lb

## 2022-10-07 DIAGNOSIS — Z13220 Encounter for screening for lipoid disorders: Secondary | ICD-10-CM

## 2022-10-07 DIAGNOSIS — Z125 Encounter for screening for malignant neoplasm of prostate: Secondary | ICD-10-CM

## 2022-10-07 DIAGNOSIS — Z0000 Encounter for general adult medical examination without abnormal findings: Secondary | ICD-10-CM

## 2022-10-07 DIAGNOSIS — N4889 Other specified disorders of penis: Secondary | ICD-10-CM

## 2022-10-07 DIAGNOSIS — N529 Male erectile dysfunction, unspecified: Secondary | ICD-10-CM

## 2022-10-07 MED ORDER — TADALAFIL 20 MG PO TABS: ORAL_TABLET | ORAL | 6 refills | Status: DC

## 2022-10-07 NOTE — Assessment & Plan Note (Signed)
Referral placed to urology.

## 2022-10-07 NOTE — Assessment & Plan Note (Signed)
Tadalafil has been effective for him.  Rx renewed.

## 2022-10-07 NOTE — Progress Notes (Signed)
Anthony Leach - 52 y.o. male MRN [id number]  Date of birth: [birth year]  Subjective Chief Complaint  Patient presents with   Abnormal Penile Curvature    HPI Anthony Leach is a 52 y.o. male here today with complaint of penile curvature.  He has noticed this over the past few weeks to months.  He has not had any pain associated with this.  Denies any know penile injury.  He does have ED and uses tadalafil as needed for this.  He does need a renewal of this medication.  No other urological procedures other than vasectomy.   ROS:  A comprehensive ROS was completed and negative except as noted per HPI  No Known Allergies  Past Medical History:  Diagnosis Date   Ganglioneuroma    Inguinal hernia     Past Surgical History:  Procedure Laterality Date   HERNIA REPAIR     THORACOTOMY  1991   Removal of benign mass in Chest area   VASECTOMY      Social History   Socioeconomic History   Marital status: Married    Spouse name: Not on file   Number of children: Not on file   Years of education: Not on file   Highest education level: Master's degree (e.g., MA, MS, MEng, MEd, MSW, MBA)  Occupational History    Employer: [employer]  Tobacco Use   Smoking status: Former    Types: Cigarettes    Quit date: 1990    Years since quitting: 34.4   Smokeless tobacco: Never  Vaping Use   Vaping Use: Never used  Substance and Sexual Activity   Alcohol use: Yes    Comment: 20 DRINKS/WK   Drug use: Never   Sexual activity: Yes    Partners: Female    Birth control/protection: None  Other Topics Concern   Not on file  Social History Narrative   Not on file   Social Determinants of Health   Financial Resource Strain: Low Risk  (10/05/2022)   Overall Financial Resource Strain (CARDIA)    Difficulty of Paying Living Expenses: Not hard at all  Food Insecurity: No Food Insecurity (10/05/2022)   Hunger Vital Sign    Worried About Running Out of Food in the Last Year: Never  true    Ran Out of Food in the Last Year: Never true  Transportation Needs: No Transportation Needs (10/05/2022)   PRAPARE - Administrator, Civil Service (Medical): No    Lack of Transportation (Non-Medical): No  Physical Activity: Insufficiently Active (10/05/2022)   Exercise Vital Sign    Days of Exercise per Week: 3 days    Minutes of Exercise per Session: 20 min  Stress: No Stress Concern Present (10/05/2022)   [employer] of Occupational Health - Occupational Stress Questionnaire    Feeling of Stress : Only a little  Social Connections: Socially Integrated (10/05/2022)   Social Connection and Isolation Panel [NHANES]    Frequency of Communication with Friends and Family: Three times a week    Frequency of Social Gatherings with Friends and Family: More than three times a week    Attends Religious Services: More than 4 times per year    Active Member of [employer] or Organizations: Yes    Attends Banker Meetings: 1 to 4 times per year    Marital Status: Married    Family History  Problem Relation Age of Onset   Diabetes Mother    High blood  pressure Father    Colon polyps Neg Hx    Colon cancer Neg Hx    Esophageal cancer Neg Hx    Stomach cancer Neg Hx    Rectal cancer Neg Hx     Health Maintenance  Topic Date Due   COVID-19 Vaccine (4 - 2023-24 season) 10/23/2022 (Originally 12/26/2021)   Zoster Vaccines- Shingrix (1 of 2) 01/07/2023 (Originally 01/18/1989)   Hepatitis C Screening  10/07/2023 (Originally 01/19/1988)   HIV Screening  10/07/2023 (Originally 01/18/1985)   INFLUENZA VACCINE  11/26/2022   DTaP/Tdap/Td (2 - Td or Tdap) 10/14/2027   Colonoscopy  11/05/2027   HPV VACCINES  Aged Out     ----------------------------------------------------------------------------------------------------------------------------------------------------------------------------------------------------------------- Physical Exam BP 110/67 (BP Location: Left  Arm, Patient Position: Sitting, Cuff Size: Normal)   Pulse (!) 59   Ht 6\' 2"  (1.88 m)   Wt 204 lb (92.5 kg)   SpO2 96%   BMI 26.19 kg/m   Physical Exam Constitutional:      Appearance: Normal appearance.  HENT:     Head: Normocephalic and atraumatic.  Neurological:     Mental Status: He is alert.  Psychiatric:        Mood and Affect: Mood normal.        Behavior: Behavior normal.     ------------------------------------------------------------------------------------------------------------------------------------------------------------------------------------------------------------------- Assessment and Plan  ED (erectile dysfunction) Tadalafil has been effective for him.  Rx renewed.   Penile curvature, acquired Referral placed to urology.    Meds ordered this encounter  Medications   tadalafil (CIALIS) 20 MG tablet    Sig: TAKE 1/2 TO 1 TABLET BY MOUTH EVERY OTHER DAY AS NEEDED FOR ERECTILE DYSFUNCTION    Dispense:  15 tablet    Refill:  6    No follow-ups on file.    This visit occurred during the SARS-CoV-2 public health emergency.  Safety protocols were in place, including screening questions prior to the visit, additional usage of staff PPE, and extensive cleaning of exam room while observing appropriate contact time as indicated for disinfecting solutions.

## 2022-10-09 LAB — COMPLETE METABOLIC PANEL WITH GFR
AG Ratio: 2 (calc) (ref 1.0–2.5)
ALT: 14 U/L (ref 9–46)
AST: 14 U/L (ref 10–35)
Albumin: 4.3 g/dL (ref 3.6–5.1)
Alkaline phosphatase (APISO): 50 U/L (ref 35–144)
BUN: 18 mg/dL (ref 7–25)
CO2: 26 mmol/L (ref 20–32)
Calcium: 9.1 mg/dL (ref 8.6–10.3)
Chloride: 105 mmol/L (ref 98–110)
Creat: 0.85 mg/dL (ref 0.70–1.30)
Globulin: 2.1 g/dL (calc) (ref 1.9–3.7)
Glucose, Bld: 97 mg/dL (ref 65–99)
Potassium: 4.9 mmol/L (ref 3.5–5.3)
Sodium: 139 mmol/L (ref 135–146)
Total Bilirubin: 0.7 mg/dL (ref 0.2–1.2)
Total Protein: 6.4 g/dL (ref 6.1–8.1)
eGFR: 105 mL/min/{1.73_m2} (ref >=60)

## 2022-10-09 LAB — CBC WITH DIFFERENTIAL/PLATELET
Absolute Monocytes: 294 cells/uL (ref 200–950)
Basophils Absolute: 29 cells/uL (ref 0–200)
Basophils Relative: 0.7 %
Eosinophils Absolute: 109 cells/uL (ref 15–500)
Eosinophils Relative: 2.6 %
HCT: 41.5 % (ref 38.5–50.0)
Hemoglobin: 13.5 g/dL (ref 13.2–17.1)
Lymphs Abs: 1180 cells/uL (ref 850–3900)
MCH: 30.3 pg (ref 27.0–33.0)
MCHC: 32.5 g/dL (ref 32.0–36.0)
MCV: 93.3 fL (ref 80.0–100.0)
MPV: 11 fL (ref 7.5–12.5)
Monocytes Relative: 7 %
Neutro Abs: 2587 cells/uL (ref 1500–7800)
Neutrophils Relative %: 61.6 %
Platelets: 232 10*3/uL (ref 140–400)
RBC: 4.45 10*6/uL (ref 4.20–5.80)
RDW: 12.1 % (ref 11.0–15.0)
Total Lymphocyte: 28.1 %
WBC: 4.2 10*3/uL (ref 3.8–10.8)

## 2022-10-09 LAB — LIPID PANEL W/REFLEX DIRECT LDL
Cholesterol: 187 mg/dL (ref <200)
HDL: 80 mg/dL (ref >=40)
LDL Cholesterol (Calc): 93 mg/dL (calc)
Non-HDL Cholesterol (Calc): 107 mg/dL (calc) (ref <130)
Total CHOL/HDL Ratio: 2.3 (calc) (ref <5.0)
Triglycerides: 59 mg/dL (ref <150)

## 2022-10-09 LAB — PSA: PSA: 1.05 ng/mL (ref <=4.00)

## 2022-10-14 ENCOUNTER — Ambulatory Visit: Payer: BC Managed Care – PPO | Admitting: Urology

## 2022-10-14 ENCOUNTER — Encounter: Payer: Self-pay | Admitting: Urology

## 2022-10-14 VITALS — BP 144/85 | HR 75 | Ht 74.0 in | Wt 200.0 lb

## 2022-10-14 DIAGNOSIS — N486 Induration penis plastica: Secondary | ICD-10-CM | POA: Diagnosis not present

## 2022-10-14 NOTE — Progress Notes (Signed)
   Assessment: 1. Peyronie's disease      Plan: Today had a long and detailed discussion with the patient regarding Perrone's disease including its natural history and standard treatment options.  We discussed standard surgical procedures as well as Xiaflex injection.  At this time it does not appear that the patient is bothered significantly by his curvature.  He is able to acquire an erection and complete intercourse without difficulty.  The degree of curvature also is probably less than that requiring intervention. Patient will follow-up as needed  Chief Complaint: penile curvature  History of Present Illness:  Anthony Leach is a 52 y.o. male who is seen in consultation from Everrett Coombe, DO for evaluation of penile curvature. Patient reports that he noticed approximately 6 months ago the development of some distal penile ventral curvature.  He has not noticed any hardness or plaque himself.  He does have VED which responds well to on-demand Cialis.  The penile curvature is not associated with any tenderness.  No history of trauma.  Past Medical History:  Past Medical History:  Diagnosis Date   Ganglioneuroma    Inguinal hernia     Past Surgical History:  Past Surgical History:  Procedure Laterality Date   HERNIA REPAIR     THORACOTOMY  1991   Removal of benign mass in Chest area   VASECTOMY      Allergies:  No Known Allergies  Family History:  Family History  Problem Relation Age of Onset   Diabetes Mother    High blood pressure Father    Colon polyps Neg Hx    Colon cancer Neg Hx    Esophageal cancer Neg Hx    Stomach cancer Neg Hx    Rectal cancer Neg Hx     Social History:  Social History   Tobacco Use   Smoking status: Former    Types: Cigarettes    Quit date: 1990    Years since quitting: 34.4   Smokeless tobacco: Never  Vaping Use   Vaping Use: Never used  Substance Use Topics   Alcohol use: Yes    Comment: 20 DRINKS/WK   Drug use: Never     Review of symptoms:  Constitutional:  Negative for unexplained weight loss, night sweats, fever, chills ENT:  Negative for nose bleeds, sinus pain, painful swallowing CV:  Negative for chest pain, shortness of breath, exercise intolerance, palpitations, loss of consciousness Resp:  Negative for cough, wheezing, shortness of breath GI:  Negative for nausea, vomiting, diarrhea, bloody stools GU:  Positives noted in HPI; otherwise negative for gross hematuria, dysuria, urinary incontinence Neuro:  Negative for seizures, poor balance, limb weakness, slurred speech Psych:  Negative for lack of energy, depression, anxiety Endocrine:  Negative for polydipsia, polyuria, symptoms of hypoglycemia (dizziness, hunger, sweating) Hematologic:  Negative for anemia, purpura, petechia, prolonged or excessive bleeding, use of anticoagulants  Allergic:  Negative for difficulty breathing or choking as a result of exposure to anything; no shellfish allergy; no allergic response (rash/itch) to materials, foods  Physical exam: BP (!) 144/85   Pulse 75   Ht 6\' 2"  (1.88 m)   Wt 200 lb (90.7 kg)   BMI 25.68 kg/m  GENERAL APPEARANCE:  Well appearing, well developed, well nourished, NAD  GU: Circumcised phallus with a small area of plaque along the dorsal aspect of the penis distally just proximal to the glans. Testes and cords normal

## 2022-12-02 IMAGING — DX DG KNEE COMPLETE 4+V*R*
4 series · 4 of 4 positions shown · non-contrast
Comparison: None Available.

CLINICAL DATA: Bilateral knee primary osteoarthritis.

EXAM:
LEFT KNEE - COMPLETE 4+ VIEW; RIGHT KNEE - COMPLETE 4+ VIEW

[knee lat]
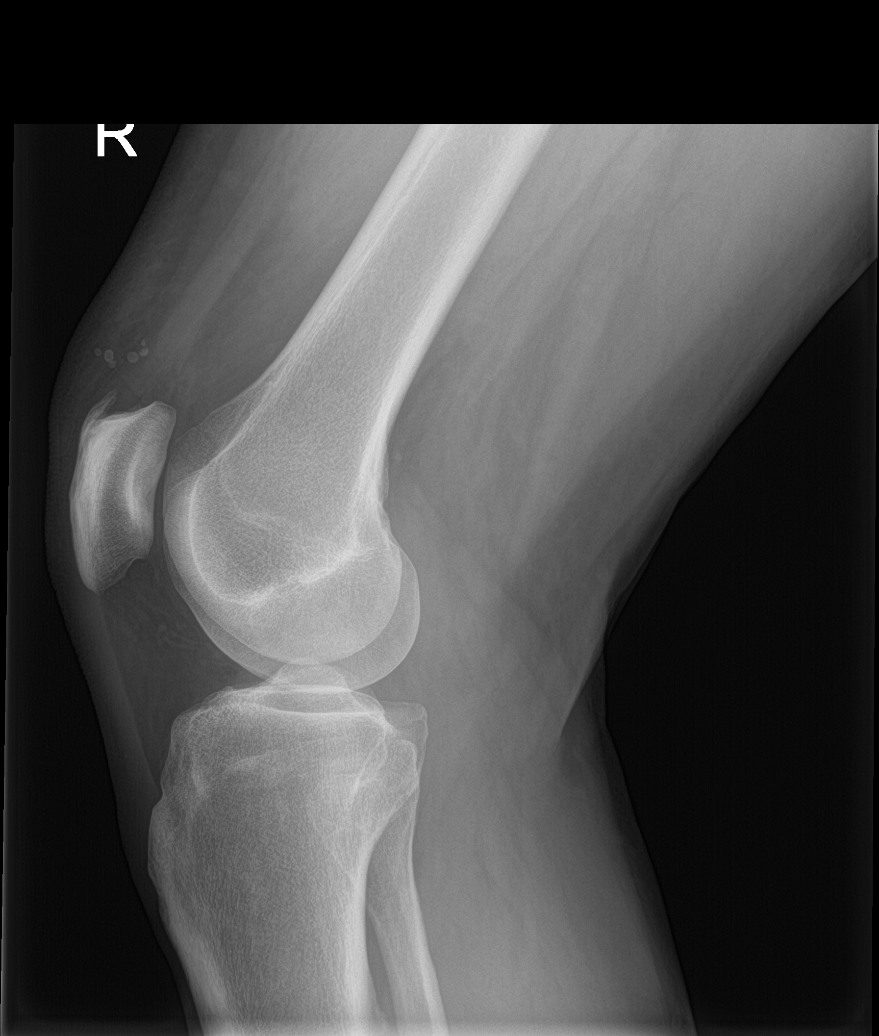

[knee ap bilat standing]
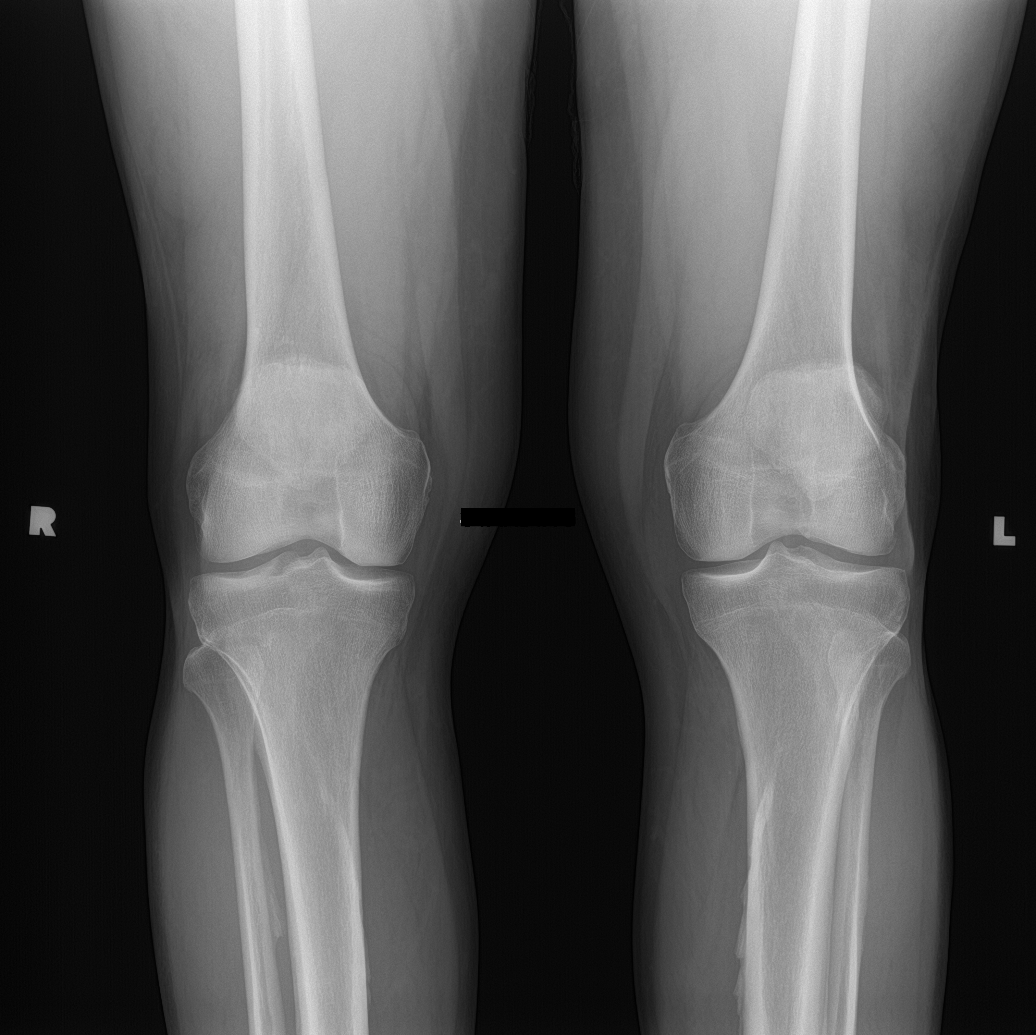

[knee sunrise standing]
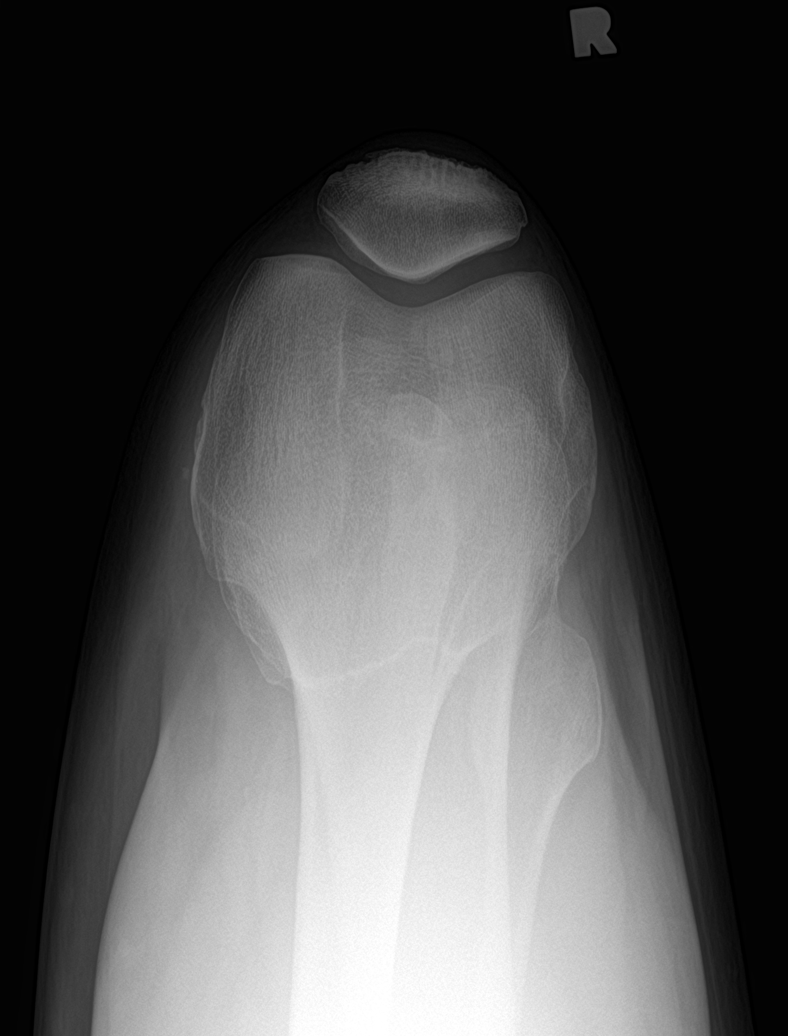

[knee tunnel]
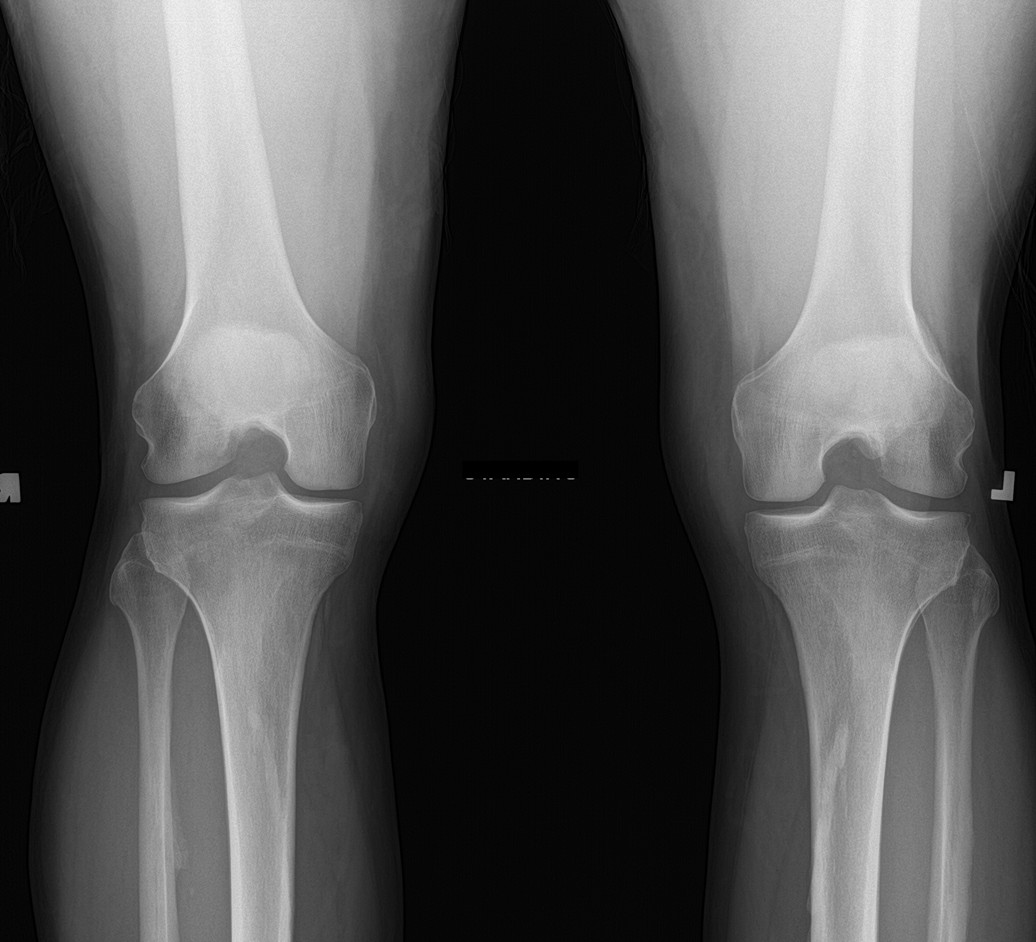

[4 of 4 positions shown; findings below may reference images not displayed]

FINDINGS: Left knee:

Minimal medial compartment joint space narrowing. Mild chronic
enthesopathic spurring at the quadriceps insertion on the patella.
No joint effusion. No acute fracture is seen. No dislocation. There
is mild cortical thickening overlying the posterior medial proximal
tibia which may reflect traction changes at the muscle attachments
to the tibia including the posterior tibial and flexor digitorum
longus.

Right knee:

PA and AP views of the right knee demonstrate minimal medial
compartment joint space narrowing.
IMPRESSION: 1. Minimal joint space narrowing of the bilateral medial
compartments.
2. Mild chronic enthesopathic spurring at the quadriceps insertion
on the patella.

## 2023-08-20 ENCOUNTER — Other Ambulatory Visit: Payer: Self-pay | Admitting: Family Medicine

## 2023-08-23 NOTE — Telephone Encounter (Signed)
 Patient scheduled for 10/15/23 for Phyical and Med Refills, thanks.

## 2023-08-23 NOTE — Telephone Encounter (Signed)
 Pls contact pt to schedule appt with Dr. Augustus Ledger. Last seen June 2024. Thx.

## 2023-10-15 ENCOUNTER — Encounter: Payer: Self-pay | Admitting: Family Medicine

## 2023-10-15 ENCOUNTER — Ambulatory Visit (INDEPENDENT_AMBULATORY_CARE_PROVIDER_SITE_OTHER): Payer: Self-pay | Admitting: Family Medicine

## 2023-10-15 VITALS — BP 120/83 | HR 56 | Ht 74.0 in | Wt 198.0 lb

## 2023-10-15 DIAGNOSIS — Z125 Encounter for screening for malignant neoplasm of prostate: Secondary | ICD-10-CM

## 2023-10-15 DIAGNOSIS — Z0000 Encounter for general adult medical examination without abnormal findings: Secondary | ICD-10-CM | POA: Diagnosis not present

## 2023-10-15 DIAGNOSIS — Z7189 Other specified counseling: Secondary | ICD-10-CM | POA: Diagnosis not present

## 2023-10-15 DIAGNOSIS — Z13220 Encounter for screening for lipoid disorders: Secondary | ICD-10-CM | POA: Diagnosis not present

## 2023-10-15 NOTE — Patient Instructions (Signed)

## 2023-10-15 NOTE — Progress Notes (Signed)
 Anthony Leach - 53 y.o. male MRN [id number]  Date of birth: [birth year]  Subjective Chief Complaint  Patient presents with   Annual Exam    HPI Anthony Leach is a 53 y.o. male here today for annual exam.   He reports that he is doing well and has no new concerns at this time. He would like to have coronary calcium score.   He continues to stay moderately active.  He feels that he is doing well with diet and consumes a good variety of foods.    He is a non-smoker.  Occasional EtOH.  Review of Systems  Constitutional:  Negative for chills, fever, malaise/fatigue and weight loss.  HENT:  Negative for congestion, ear pain and sore throat.   Eyes:  Negative for blurred vision, double vision and pain.  Respiratory:  Negative for cough and shortness of breath.   Cardiovascular:  Negative for chest pain and palpitations.  Gastrointestinal:  Negative for abdominal pain, blood in stool, constipation, heartburn and nausea.  Genitourinary:  Negative for dysuria and urgency.  Musculoskeletal:  Negative for joint pain and myalgias.  Neurological:  Negative for dizziness and headaches.  Endo/Heme/Allergies:  Does not bruise/bleed easily.  Psychiatric/Behavioral:  Negative for depression. The patient is not nervous/anxious and does not have insomnia.     No Known Allergies  Past Medical History:  Diagnosis Date   Ganglioneuroma    Inguinal hernia     Past Surgical History:  Procedure Laterality Date   HERNIA REPAIR     THORACOTOMY  1991   Removal of benign mass in Chest area   VASECTOMY      Social History   Socioeconomic History   Marital status: Married    Spouse name: Not on file   Number of children: Not on file   Years of education: Not on file   Highest education level: Master's degree (e.g., MA, MS, MEng, MEd, MSW, MBA)  Occupational History    Employer: [employer]  Tobacco Use   Smoking status: Former    Current packs/day: 0.00    Types: Cigarettes     Quit date: 1990    Years since quitting: 35.4   Smokeless tobacco: Never  Vaping Use   Vaping status: Never Used  Substance and Sexual Activity   Alcohol use: Yes    Comment: 20 DRINKS/WK   Drug use: Never   Sexual activity: Yes    Partners: Female    Birth control/protection: None  Other Topics Concern   Not on file  Social History Narrative   Not on file   Social Drivers of Health   Financial Resource Strain: Low Risk  (10/11/2023)   Overall Financial Resource Strain (CARDIA)    Difficulty of Paying Living Expenses: Not hard at all  Food Insecurity: No Food Insecurity (10/11/2023)   Hunger Vital Sign    Worried About Running Out of Food in the Last Year: Never true    Ran Out of Food in the Last Year: Never true  Transportation Needs: No Transportation Needs (10/11/2023)   PRAPARE - Administrator, Civil Service (Medical): No    Lack of Transportation (Non-Medical): No  Physical Activity: Sufficiently Active (10/11/2023)   Exercise Vital Sign    Days of Exercise per Week: 6 days    Minutes of Exercise per Session: 30 min  Stress: No Stress Concern Present (10/11/2023)   [employer] of Occupational Health - Occupational Stress Questionnaire    Feeling  of Stress: Only a little  Social Connections: Socially Integrated (10/11/2023)   Social Connection and Isolation Panel    Frequency of Communication with Friends and Family: Twice a week    Frequency of Social Gatherings with Friends and Family: More than three times a week    Attends Religious Services: More than 4 times per year    Active Member of [employer] or Organizations: Yes    Attends Banker Meetings: 1 to 4 times per year    Marital Status: Married    Family History  Problem Relation Age of Onset   Diabetes Mother    High blood pressure Father    Colon polyps Neg Hx    Colon cancer Neg Hx    Esophageal cancer Neg Hx    Stomach cancer Neg Hx    Rectal cancer Neg Hx     Health  Maintenance  Topic Date Due   HIV Screening  Never done   Hepatitis C Screening  Never done   Zoster Vaccines- Shingrix (1 of 2) Never done   COVID-19 Vaccine (4 - 2024-25 season) 12/27/2022   INFLUENZA VACCINE  11/26/2023   DTaP/Tdap/Td (2 - Td or Tdap) 10/14/2027   Colonoscopy  11/05/2027   HPV VACCINES  Aged Out   Meningococcal B Vaccine  Aged Out     ----------------------------------------------------------------------------------------------------------------------------------------------------------------------------------------------------------------- Physical Exam BP 120/83   Pulse (!) 56   Ht 6' 2 (1.88 m)   Wt 198 lb (89.8 kg)   SpO2 98%   BMI 25.42 kg/m   Physical Exam Constitutional:      General: He is not in acute distress. HENT:     Head: Normocephalic and atraumatic.     Right Ear: Tympanic membrane and external ear normal.     Left Ear: Tympanic membrane and external ear normal.   Eyes:     General: No scleral icterus.  Neck:     Thyroid: No thyromegaly.   Cardiovascular:     Rate and Rhythm: Normal rate and regular rhythm.     Heart sounds: Normal heart sounds.  Pulmonary:     Effort: Pulmonary effort is normal.     Breath sounds: Normal breath sounds.  Abdominal:     General: Bowel sounds are normal. There is no distension.     Palpations: Abdomen is soft.     Tenderness: There is no abdominal tenderness. There is no guarding.   Musculoskeletal:     Cervical back: Normal range of motion.  Lymphadenopathy:     Cervical: No cervical adenopathy.   Skin:    General: Skin is warm and dry.     Findings: No rash.     Comments: Moderate SK on R flank.  Multiple smaller SK on back.    Neurological:     Mental Status: He is alert and oriented to person, place, and time.     Cranial Nerves: No cranial nerve deficit.     Motor: No abnormal muscle tone.   Psychiatric:        Mood and Affect: Mood normal.        Behavior: Behavior normal.      ------------------------------------------------------------------------------------------------------------------------------------------------------------------------------------------------------------------- Assessment and Plan  Well adult exam Well adult Orders Placed This Encounter  Procedures   CT CARDIAC SCORING (SELF PAY ONLY)    Standing Status:   Future    Expiration Date:   10/14/2024    Preferred imaging location?:   [facility]   PSA   Lipid Panel With  LDL/HDL Ratio   CBC with Differential   CMP14+EGFR  Screening: Per lab orders Immunizations:  He will have Shingrix completed at local pharmacy.  Anticipatory guidance/Risk factor Reduction:  Recommendations per AVS.     No orders of the defined types were placed in this encounter.   No follow-ups on file.

## 2023-10-15 NOTE — Assessment & Plan Note (Addendum)
 Well adult Orders Placed This Encounter  Procedures   CT CARDIAC SCORING (SELF PAY ONLY)    Standing Status:   Future    Expiration Date:   10/14/2024    Preferred imaging location?:   [facility]   PSA   Lipid Panel With LDL/HDL Ratio   CBC with Differential   CMP14+EGFR  Screening: Per lab orders Immunizations:  He will have Shingrix completed at local pharmacy.  Anticipatory guidance/Risk factor Reduction:  Recommendations per AVS.

## 2023-10-16 LAB — CBC WITH DIFFERENTIAL/PLATELET
Basophils Absolute: 0 10*3/uL (ref 0.0–0.2)
Basos: 1 %
EOS (ABSOLUTE): 0.1 10*3/uL (ref 0.0–0.4)
Eos: 3 %
Hematocrit: 42.5 % (ref 37.5–51.0)
Hemoglobin: 13.5 g/dL (ref 13.0–17.7)
Immature Grans (Abs): 0 10*3/uL (ref 0.0–0.1)
Immature Granulocytes: 0 %
Lymphocytes Absolute: 1 10*3/uL (ref 0.7–3.1)
Lymphs: 25 %
MCH: 30.1 pg (ref 26.6–33.0)
MCHC: 31.8 g/dL (ref 31.5–35.7)
MCV: 95 fL (ref 79–97)
Monocytes Absolute: 0.3 10*3/uL (ref 0.1–0.9)
Monocytes: 8 %
Neutrophils Absolute: 2.7 10*3/uL (ref 1.4–7.0)
Neutrophils: 63 %
Platelets: 212 10*3/uL (ref 150–450)
RBC: 4.49 x10E6/uL (ref 4.14–5.80)
RDW: 12.3 % (ref 11.6–15.4)
WBC: 4.2 10*3/uL (ref 3.4–10.8)

## 2023-10-16 LAB — CMP14+EGFR
ALT: 17 IU/L (ref 0–44)
AST: 18 IU/L (ref 0–40)
Albumin: 4.4 g/dL (ref 3.8–4.9)
Alkaline Phosphatase: 53 IU/L (ref 44–121)
BUN/Creatinine Ratio: 19 (ref 9–20)
BUN: 17 mg/dL (ref 6–24)
Bilirubin Total: 0.7 mg/dL (ref 0.0–1.2)
CO2: 21 mmol/L (ref 20–29)
Calcium: 9.2 mg/dL (ref 8.7–10.2)
Chloride: 102 mmol/L (ref 96–106)
Creatinine, Ser: 0.91 mg/dL (ref 0.76–1.27)
Globulin, Total: 1.8 g/dL (ref 1.5–4.5)
Glucose: 97 mg/dL (ref 70–99)
Potassium: 4.4 mmol/L (ref 3.5–5.2)
Sodium: 139 mmol/L (ref 134–144)
Total Protein: 6.2 g/dL (ref 6.0–8.5)
eGFR: 101 mL/min/{1.73_m2} (ref >59)

## 2023-10-16 LAB — PSA: Prostate Specific Ag, Serum: 0.9 ng/mL (ref 0.0–4.0)

## 2023-10-16 LAB — LIPID PANEL WITH LDL/HDL RATIO
Cholesterol, Total: 190 mg/dL (ref 100–199)
HDL: 70 mg/dL (ref >39)
LDL Chol Calc (NIH): 106 mg/dL — ABNORMAL HIGH (ref 0–99)
LDL/HDL Ratio: 1.5 ratio (ref 0.0–3.6)
Triglycerides: 78 mg/dL (ref 0–149)
VLDL Cholesterol Cal: 14 mg/dL (ref 5–40)

## 2023-10-18 ENCOUNTER — Ambulatory Visit (INDEPENDENT_AMBULATORY_CARE_PROVIDER_SITE_OTHER): Payer: Self-pay

## 2023-10-18 DIAGNOSIS — Z7189 Other specified counseling: Secondary | ICD-10-CM

## 2023-10-26 ENCOUNTER — Ambulatory Visit: Payer: Self-pay | Admitting: Family Medicine

## 2023-12-28 ENCOUNTER — Encounter: Payer: Self-pay | Admitting: Sports Medicine

## 2024-02-08 ENCOUNTER — Other Ambulatory Visit: Payer: Self-pay | Admitting: Family Medicine

## 2024-02-08 ENCOUNTER — Encounter: Payer: Self-pay | Admitting: Family Medicine

## 2024-02-08 MED ORDER — TADALAFIL 20 MG PO TABS: ORAL_TABLET | ORAL | 1 refills | Status: DC
